# Patient Record
Sex: Female | Born: 1990 | Race: White | Hispanic: No | Smoking: Never smoker
Health system: Southern US, Community
[De-identification: ages and names within clinical notes are randomized; demographics above are authoritative.]

## PROBLEM LIST (undated history)

## (undated) DIAGNOSIS — F3281 Premenstrual dysphoric disorder: Secondary | ICD-10-CM

## (undated) DIAGNOSIS — I7300 Raynaud's syndrome without gangrene: Secondary | ICD-10-CM

## (undated) HISTORY — DX: Raynaud's syndrome without gangrene: I73.00

## (undated) HISTORY — PX: WISDOM TOOTH EXTRACTION: SHX21

## (undated) HISTORY — DX: Premenstrual dysphoric disorder: F32.81

---
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
## 2012-04-03 ENCOUNTER — Ambulatory Visit: Payer: Self-pay | Admitting: Family Medicine

## 2012-08-11 IMAGING — CT CT STONE STUDY
1 of 2 series · 15 of 32 positions shown, 19 images · non-contrast
Comparison: none

REASON FOR EXAM: [id number] fever   abd pain  kidney stones  hematuria
COMMENTS:

PROCEDURE:     CT  - CT ABDOMEN /PELVIS WO (STONE)  - April 03, 2012  [DATE]
RESULT:     Comparison: 06/14/2009
TECHNIQUE: Multiple axial images from the lung bases to the symphysis pubis
were obtained without oral and without intravenous contrast.

[Series 2: 3mm soft tissue · axial · 0.68mm/px · z∈[-718,-334]mm · 15 of 140 slices shown, 19 images]
[im 6/140  soft-tissue]
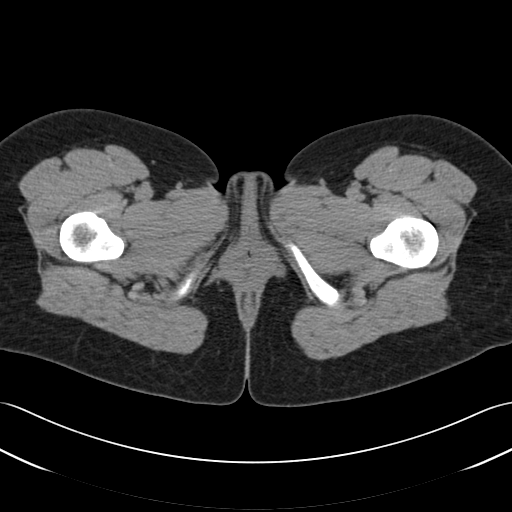
[im 6/140  bone]
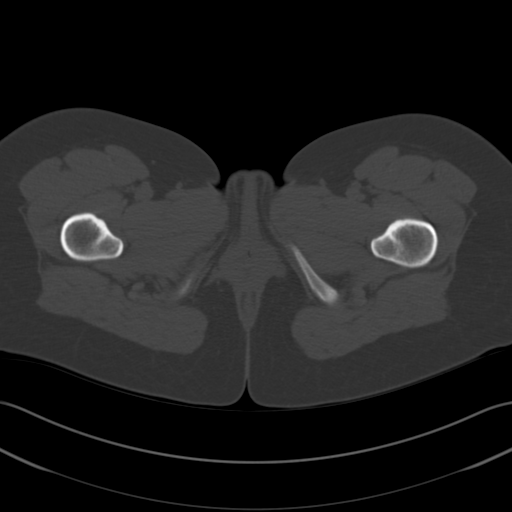
[im 18/140  soft-tissue]
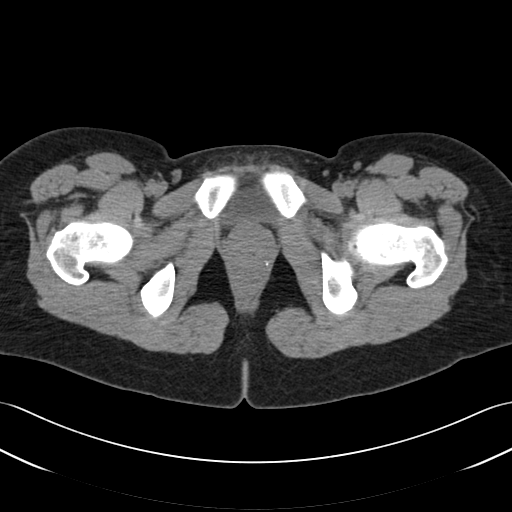
[im 29/140  soft-tissue]
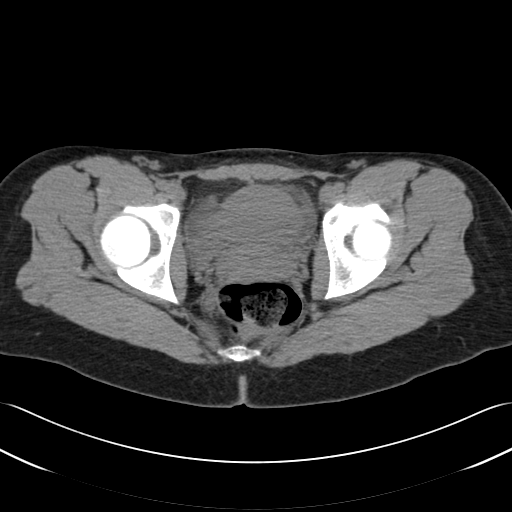
[im 41/140  soft-tissue]
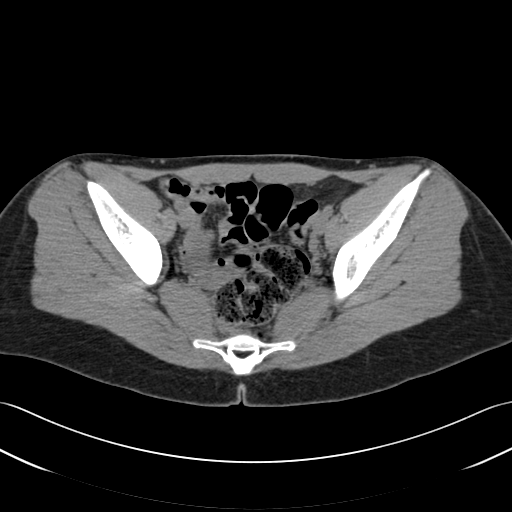
[im 47/140  soft-tissue]
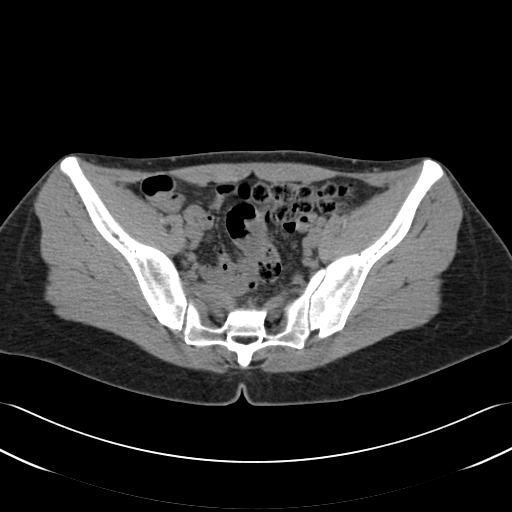
[im 58/140  soft-tissue]
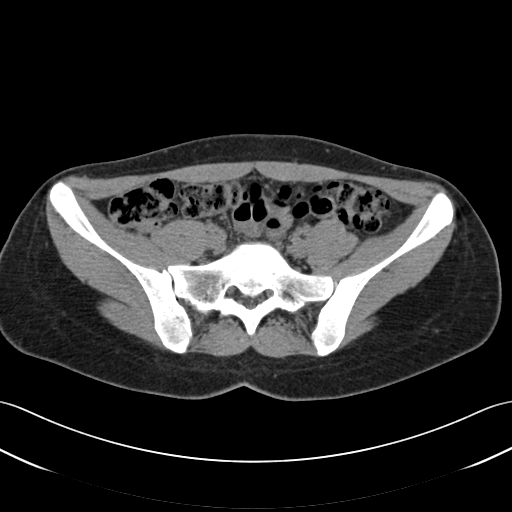
[im 70/140  soft-tissue]
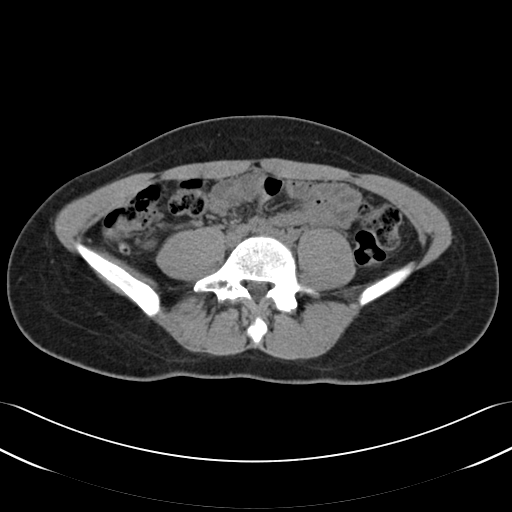
[im 82/140  soft-tissue]
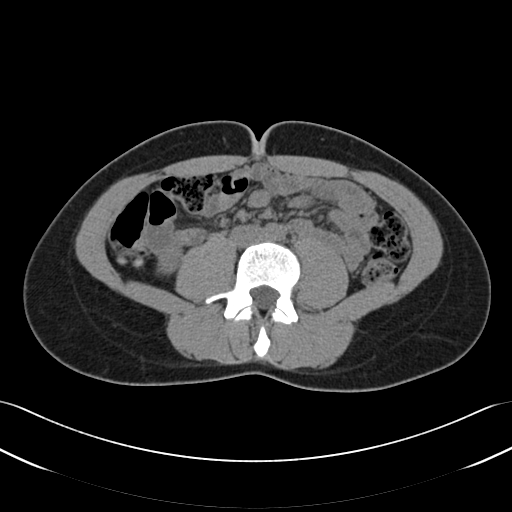
[im 93/140  soft-tissue]
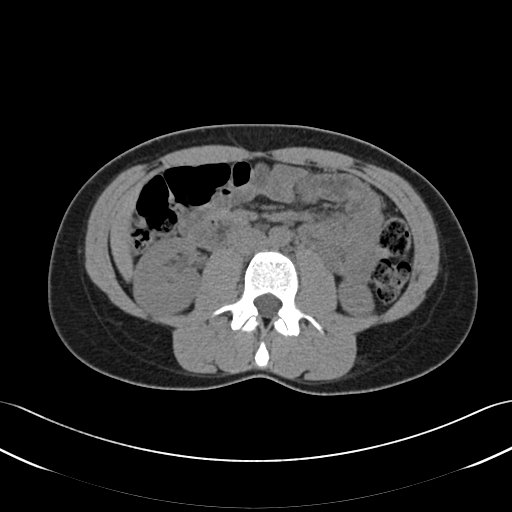
[im 93/140  bone]
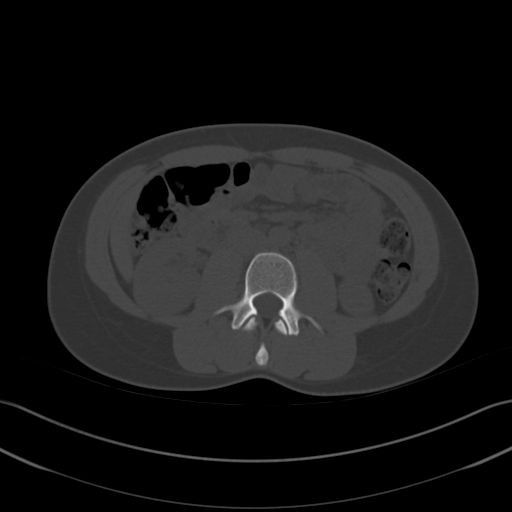
[im 99/140  soft-tissue]
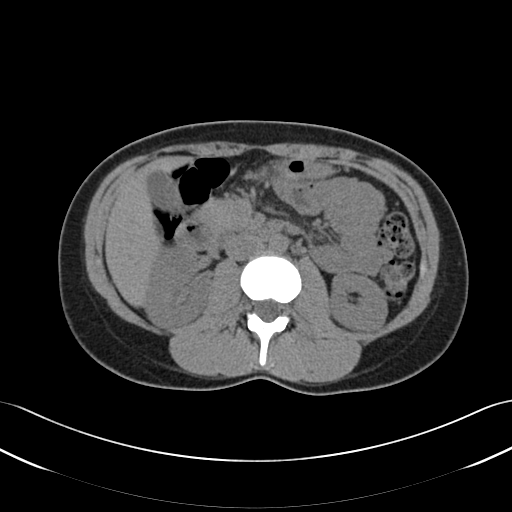
[im 111/140  soft-tissue]
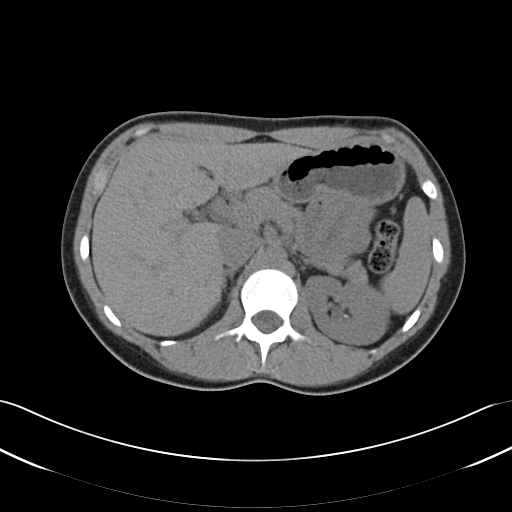
[im 116/140  lung]
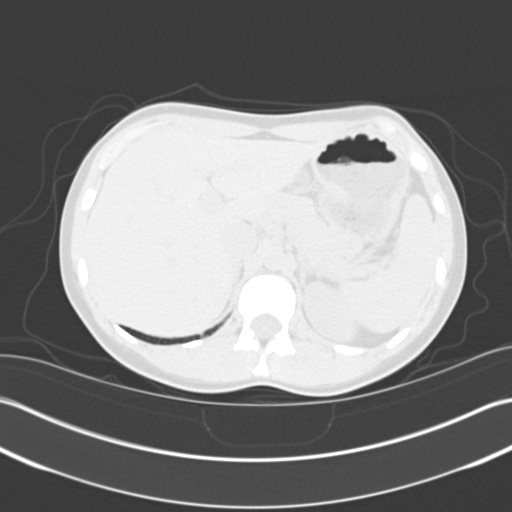
[im 122/140  soft-tissue]
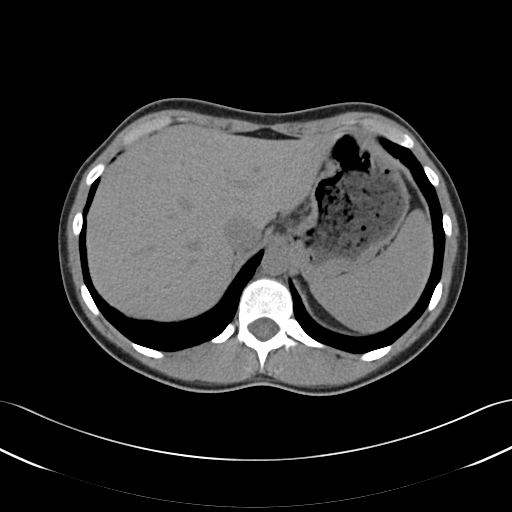
[im 122/140  lung]
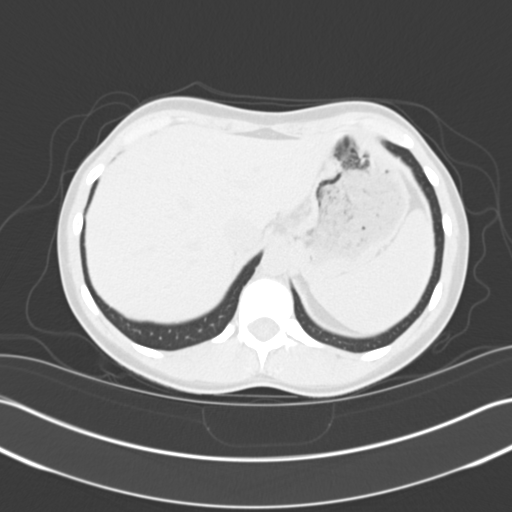
[im 128/140  lung]
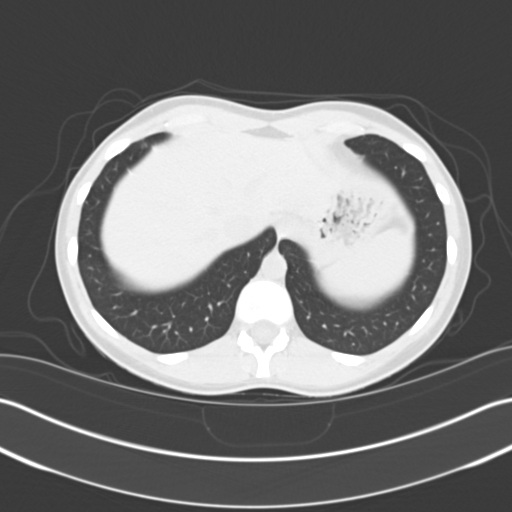
[im 134/140  soft-tissue]
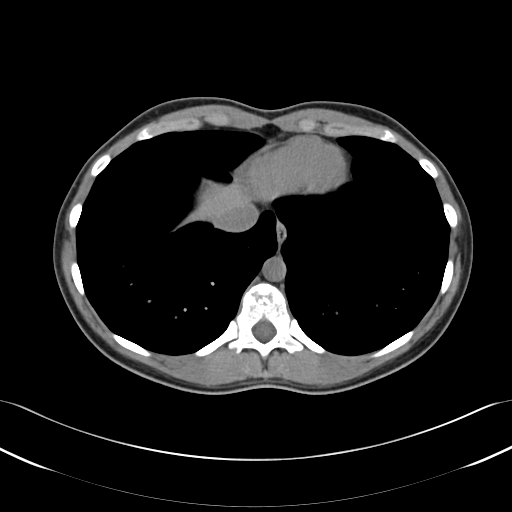
[im 134/140  lung]
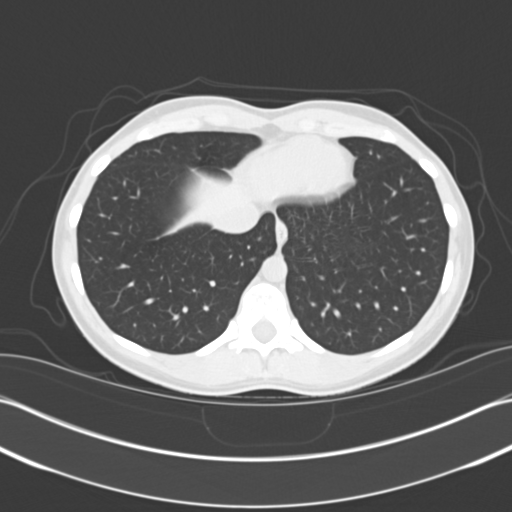

[15 of 32 positions shown; findings below may reference images not displayed]

FINDINGS: Lack of intravenous contrast limits evaluation of the solid abdominal
organs.  Grossly, the liver, spleen, adrenals, gallbladder, and pancreas are
unremarkable. There are several 2-3 mm calculi in the bilateral kidneys. No
hydronephrosis. Evaluation of the ureters is difficult secondary to the lack
of intra-abdominal fat and decompressed small bowel. However, no evidence of
ureterectasis.

The small and large bowel are normal in caliber. The appendix is normal in
caliber. There is some high density within the appendix which may represent
inspissated material.

No aggressive lytic or sclerotic osseous lesions are identified.
IMPRESSION: Bilateral nephrolithiasis, without evidence of obstruction.

## 2017-03-11 DIAGNOSIS — T63004A Toxic effect of unspecified snake venom, undetermined, initial encounter: Secondary | ICD-10-CM | POA: Insufficient documentation

## 2017-03-11 HISTORY — DX: Toxic effect of unspecified snake venom, undetermined, initial encounter: T63.004A

## 2019-09-18 NOTE — L&D Delivery Note (Signed)
Delivery Note At 12:46 PM a viable female was delivered via Vaginal, Spontaneous (Presentation:   Occiput Anterior).  APGAR: 9, 9; weight pending.   Placenta status: Spontaneous, Intact.  Cord: 3 vessels with the following complications: None.  Cord pH: n/a  Anesthesia: Epidural Episiotomy: None Lacerations: 2nd degree;Perineal Suture Repair: 3.0 vicryl Est. Blood Loss (mL): 320  Mom to postpartum.  Baby to Couplet care / Skin to Skin.  Called to see patient.  Mom pushed to deliver a viable female infant.  The head followed by shoulders, which delivered without difficulty, and the rest of the body.  No nuchal cord noted.  Baby to mom's chest.  Cord clamped and cut after > 1 min delay.  Cord blood obtained.  Placenta delivered spontaneously, intact, with a 3-vessel cord.  Second degree perineal laceration repaired with 3-0 Vicryl in standard fashion.  All counts correct.  Hemostasis obtained with IV pitocin and fundal massage. EBL 320 mL.    Thomasene Mohair, MD 08/30/2020, 4:23 PM

## 2020-01-18 ENCOUNTER — Emergency Department
Admission: EM | Admit: 2020-01-18 | Discharge: 2020-01-19 | Disposition: A | Discharge status: Home / Self Care | Payer: 59 | Attending: Emergency Medicine | Admitting: Emergency Medicine

## 2020-01-18 ENCOUNTER — Encounter: Payer: Self-pay | Admitting: Emergency Medicine

## 2020-01-18 DIAGNOSIS — Z3A01 Less than 8 weeks gestation of pregnancy: Secondary | ICD-10-CM | POA: Insufficient documentation

## 2020-01-18 DIAGNOSIS — O26891 Other specified pregnancy related conditions, first trimester: Secondary | ICD-10-CM | POA: Insufficient documentation

## 2020-01-18 DIAGNOSIS — R112 Nausea with vomiting, unspecified: Secondary | ICD-10-CM | POA: Insufficient documentation

## 2020-01-18 LAB — CBC
HCT: 34.8 % — ABNORMAL LOW (ref 36.0–46.0)
Hemoglobin: 13 g/dL (ref 12.0–15.0)
MCH: 32.7 pg (ref 26.0–34.0)
MCHC: 37.4 g/dL — ABNORMAL HIGH (ref 30.0–36.0)
MCV: 87.7 fL (ref 80.0–100.0)
Platelets: 224 10*3/uL (ref 150–400)
RBC: 3.97 MIL/uL (ref 3.87–5.11)
RDW: 11.7 % (ref 11.5–15.5)
WBC: 6.9 10*3/uL (ref 4.0–10.5)
nRBC: 0 % (ref 0.0–0.2)

## 2020-01-18 LAB — COMPREHENSIVE METABOLIC PANEL
ALT: 32 U/L (ref 0–44)
AST: 25 U/L (ref 15–41)
Albumin: 4.7 g/dL (ref 3.5–5.0)
Alkaline Phosphatase: 40 U/L (ref 38–126)
Anion gap: 9 (ref 5–15)
BUN: 11 mg/dL (ref 6–20)
CO2: 23 mmol/L (ref 22–32)
Calcium: 9.2 mg/dL (ref 8.9–10.3)
Chloride: 103 mmol/L (ref 98–111)
Creatinine, Ser: 0.67 mg/dL (ref 0.44–1.00)
GFR calc Af Amer: >60 mL/min (ref >60)
GFR calc non Af Amer: >60 mL/min (ref >60)
Glucose, Bld: 87 mg/dL (ref 70–99)
Potassium: 3.8 mmol/L (ref 3.5–5.1)
Sodium: 135 mmol/L (ref 135–145)
Total Bilirubin: 0.8 mg/dL (ref 0.3–1.2)
Total Protein: 7.9 g/dL (ref 6.5–8.1)

## 2020-01-18 LAB — URINALYSIS, COMPLETE (UACMP) WITH MICROSCOPIC
Bilirubin Urine: NEGATIVE
Glucose, UA: NEGATIVE mg/dL
Hgb urine dipstick: NEGATIVE
Ketones, ur: 20 mg/dL — AB
Leukocytes,Ua: NEGATIVE
Nitrite: NEGATIVE
Protein, ur: NEGATIVE mg/dL
Specific Gravity, Urine: 1.01 (ref 1.005–1.030)
pH: 6 (ref 5.0–8.0)

## 2020-01-18 LAB — HCG, QUANTITATIVE, PREGNANCY: hCG, Beta Chain, Quant, S: 165126 m[IU]/mL — ABNORMAL HIGH (ref <5)

## 2020-01-18 LAB — LIPASE, BLOOD: Lipase: 27 U/L (ref 11–51)

## 2020-01-18 LAB — ABO/RH: ABO/RH(D): O POS

## 2020-01-18 MED ORDER — ONDANSETRON HCL 4 MG PO TABS: 4.0000 mg | ORAL_TABLET | Freq: Three times a day (TID) | ORAL | 1 refills | Status: AC | PRN

## 2020-01-18 MED ORDER — CEPHALEXIN 500 MG PO CAPS: 500.0000 mg | ORAL_CAPSULE | Freq: Three times a day (TID) | ORAL | 0 refills | Status: AC

## 2020-01-18 MED ORDER — ONDANSETRON HCL 4 MG/2ML IJ SOLN
4.0000 mg | Freq: Once | INTRAMUSCULAR | Status: AC
  Administered 2020-01-18: 4 mg via INTRAVENOUS
  Filled 2020-01-18: qty 2

## 2020-01-18 MED ORDER — ONDANSETRON 4 MG PO TBDP
4.0000 mg | ORAL_TABLET | Freq: Once | ORAL | Status: AC
  Administered 2020-01-19: 4 mg via ORAL
  Filled 2020-01-18: qty 1

## 2020-01-18 MED ORDER — SODIUM CHLORIDE 0.9 % IV BOLUS
1000.0000 mL | Freq: Once | INTRAVENOUS | Status: AC
  Administered 2020-01-18: 22:00:00 1000 mL via INTRAVENOUS

## 2020-01-18 NOTE — ED Provider Notes (Signed)
Emergency Department Provider Note  ____________________________________________  Time seen: Approximately 10:05 PM  I have reviewed the triage vital signs and the nursing notes.   HISTORY  Chief Complaint Emesis and Nausea   Historian Patient     HPI Jean Gay is a 29 y.o. female presents to the emergency department with nausea and vomiting.  Patient is a G1, P0 at approximately 7 weeks.  She has had daily nausea since finding out she was pregnant and has been unable to tolerate fluids at home.   History reviewed. No pertinent past medical history.   Immunizations up to date:  Yes.     History reviewed. No pertinent past medical history.  There are no problems to display for this patient.   History reviewed. No pertinent surgical history.  Prior to Admission medications   Medication Sig Start Date End Date Taking? Authorizing Provider  cephALEXin (KEFLEX) 500 MG capsule Take 1 capsule (500 mg total) by mouth 3 (three) times daily for 7 days. 01/18/20 01/25/20  Lannie Fields, PA-C  ondansetron (ZOFRAN) 4 MG tablet Take 1 tablet (4 mg total) by mouth every 8 (eight) hours as needed for up to 7 days for nausea or vomiting. 01/18/20 01/25/20  Lannie Fields, PA-C    Allergies Patient has no known allergies.  History reviewed. No pertinent family history.  Social History Social History   Tobacco Use  . Smoking status: Never Smoker  . Smokeless tobacco: Never Used  Substance Use Topics  . Alcohol use: Not on file  . Drug use: Not on file     Review of Systems  Constitutional: No fever/chills Eyes:  No discharge ENT: No upper respiratory complaints. Respiratory: no cough. No SOB/ use of accessory muscles to breath Gastrointestinal:  Patient has nausea and emesis.  No diarrhea.  No constipation. Musculoskeletal: Negative for musculoskeletal pain.     ____________________________________________   PHYSICAL EXAM:  VITAL SIGNS: ED Triage Vitals  [01/18/20 1908]  Enc Vitals Group     BP 134/84     Pulse Rate 85     Resp 18     Temp 98 F (36.7 C)     Temp Source Oral     SpO2 100 %     Weight      Height      Head Circumference      Peak Flow      Pain Score      Pain Loc      Pain Edu?      Excl. in [place]?      Constitutional: Alert and oriented. Well appearing and in no acute distress. Eyes: Conjunctivae are normal. PERRL. EOMI. Head: Atraumatic. Cardiovascular: Normal rate, regular rhythm. Normal S1 and S2.  Good peripheral circulation. Respiratory: Normal respiratory effort without tachypnea or retractions. Lungs CTAB. Good air entry to the bases with no decreased or absent breath sounds Gastrointestinal: Bowel sounds x 4 quadrants. Soft and nontender to palpation. No guarding or rigidity. No distention. Musculoskeletal: Full range of motion to all extremities. No obvious deformities noted Neurologic:  Normal for age. No gross focal neurologic deficits are appreciated.  Skin:  Skin is warm, dry and intact. No rash noted. Psychiatric: Mood and affect are normal for age. Speech and behavior are normal.   ____________________________________________   LABS (all labs ordered are listed, but only abnormal results are displayed)  Labs Reviewed  CBC - Abnormal; Notable for the following components:      Result Value  HCT 34.8 (*)    MCHC 37.4 (*)    All other components within normal limits  URINALYSIS, COMPLETE (UACMP) WITH MICROSCOPIC - Abnormal; Notable for the following components:   Color, Urine YELLOW (*)    APPearance CLEAR (*)    Ketones, ur 20 (*)    Bacteria, UA RARE (*)    All other components within normal limits  HCG, QUANTITATIVE, PREGNANCY - Abnormal; Notable for the following components:   hCG, Beta Chain, Quant, S 165,126 (*)    All other components within normal limits  LIPASE, BLOOD  COMPREHENSIVE METABOLIC PANEL  ABO/RH    ____________________________________________  EKG   ____________________________________________  RADIOLOGY   No results found.  ____________________________________________    PROCEDURES  Procedure(s) performed:     Procedures     Medications  ondansetron (ZOFRAN-ODT) disintegrating tablet 4 mg (has no administration in time range)  sodium chloride 0.9 % bolus 1,000 mL (0 mLs Intravenous Stopped 01/18/20 2308)  ondansetron (ZOFRAN) injection 4 mg (4 mg Intravenous Given 01/18/20 2133)     ____________________________________________   INITIAL IMPRESSION / ASSESSMENT AND PLAN / ED COURSE  Pertinent labs & imaging results that were available during my care of the patient were reviewed by me and considered in my medical decision making (see chart for details).     Assessment and Plan:  Hyperemesis of pregnancy 29 year old female presents to the emergency department with hyperemesis of the first trimester.  Vital signs were reassuring at triage.  On physical exam, abdomen was soft and nontender without guarding.  Beta-hCG was elevated as expected.  CMP was within reference range.  Lipase  was within reference range.  Patient was given supplemental fluids and Zofran in the emergency department and passed a p.o. challenge.  She was discharged with Zofran and advised to consume frequent small amounts of fluid over the next several days.  Patient denied abdominal pain, pelvic pain or vaginal bleeding during this emergency department encounter.  Return precautions were given to return with new or worsening symptoms.  Patient was discharged with Keflex given bacteriuria identified on UA.   ____________________________________________  FINAL CLINICAL IMPRESSION(S) / ED DIAGNOSES  Final diagnoses:  Non-intractable vomiting with nausea, unspecified vomiting type      NEW MEDICATIONS STARTED DURING THIS VISIT:  ED Discharge Orders         Ordered    ondansetron  (ZOFRAN) 4 MG tablet  Every 8 hours PRN     01/18/20 2301    cephALEXin (KEFLEX) 500 MG capsule  3 times daily     01/18/20 2309              This chart was dictated using voice recognition software/Dragon. Despite best efforts to proofread, errors can occur which can change the meaning. Any change was purely unintentional.     Orvil Feil, PA-C 01/18/20 2312    Dionne Bucy, MD 01/18/20 [phone]

## 2020-01-18 NOTE — ED Triage Notes (Signed)
Pt reports she is approx. [redacted] weeks pregnant and has been experiencing increased N/V with weight loss over the last 2 weeks. Pt unable to get into OB until 5/12. Pt concerned for dehydration.

## 2020-01-18 NOTE — Discharge Instructions (Addendum)
Take Zofran up to three times daily.  Drink small amounts of liquids frequently throughout the day. Return to the emergency department with new or worsening symptoms.

## 2020-01-27 ENCOUNTER — Ambulatory Visit (INDEPENDENT_AMBULATORY_CARE_PROVIDER_SITE_OTHER): Payer: 59 | Admitting: Obstetrics and Gynecology

## 2020-01-27 ENCOUNTER — Encounter: Payer: Self-pay | Admitting: Obstetrics and Gynecology

## 2020-01-27 ENCOUNTER — Other Ambulatory Visit: Payer: Self-pay

## 2020-01-27 VITALS — BP 110/64 | HR 84 | Ht 65.75 in | Wt 150.0 lb

## 2020-01-27 DIAGNOSIS — Z3401 Encounter for supervision of normal first pregnancy, first trimester: Secondary | ICD-10-CM

## 2020-01-27 DIAGNOSIS — Z3480 Encounter for supervision of other normal pregnancy, unspecified trimester: Secondary | ICD-10-CM | POA: Insufficient documentation

## 2020-01-27 DIAGNOSIS — O210 Mild hyperemesis gravidarum: Secondary | ICD-10-CM

## 2020-01-27 DIAGNOSIS — K5900 Constipation, unspecified: Secondary | ICD-10-CM

## 2020-01-27 DIAGNOSIS — Z3A01 Less than 8 weeks gestation of pregnancy: Secondary | ICD-10-CM

## 2020-01-27 DIAGNOSIS — O99611 Diseases of the digestive system complicating pregnancy, first trimester: Secondary | ICD-10-CM

## 2020-01-27 LAB — OB RESULTS CONSOLE VARICELLA ZOSTER ANTIBODY, IGG: Varicella: NON-IMMUNE/NOT IMMUNE

## 2020-01-27 MED ORDER — POLYETHYLENE GLYCOL 3350 17 G PO PACK: 17.0000 g | PACK | Freq: Every day | ORAL | 11 refills | Status: DC

## 2020-01-27 MED ORDER — DOCUSATE SODIUM 100 MG PO CAPS: 100.0000 mg | ORAL_CAPSULE | Freq: Two times a day (BID) | ORAL | 3 refills | Status: DC | PRN

## 2020-01-27 MED ORDER — PROMETHAZINE HCL 25 MG PO TABS: 25.0000 mg | ORAL_TABLET | Freq: Four times a day (QID) | ORAL | 3 refills | Status: DC | PRN

## 2020-01-27 MED ORDER — ONDANSETRON 4 MG PO TBDP: 4.0000 mg | ORAL_TABLET | Freq: Four times a day (QID) | ORAL | 3 refills | Status: DC | PRN

## 2020-01-27 NOTE — Patient Instructions (Addendum)
Initial steps to help :   B6 (pyridoxine) 25 mg,  3-4 times a day Unisom (doxylamine) 25 mg at bedtime **B6 and Unisom are available as a combination prescription medications called diclegis and bonjesta  B1 (thiamin)  50-100 mg 1-4 a day  Continue prenatal vitamin with iron and thiamin. If it is not tolerated switch to 1 mg of folic acid.  Can add medication for gastric reflux if needed.  Subsequent steps to be added to B1, B6, and Unisom:  1. Antihistamine (one of the following medications) Dramamine      25-50 mg every 4-6 hours Benadryl      25-50 mg every 4-6 hours Meclizine      25 mg every 6 hours  2. Dopamine Antagonist (one of the following medications) Metoclopramide  (Reglan)  5-10 mg every 6-8 hours         PO Promethazine   (Phenergan)   12.5-25 mg every 4-6 hours      PO or rectal Prochlorperazine  (Compazine)  5-10 mg every 6-8 hours     25mg  BID rectally    Subsequent steps if there has still not been improvement in symptoms:  3. Daily stool softner  4. Ondansetron  (Zofran)   4-8 mg every 6-8 hours      First Trimester of Pregnancy The first trimester of pregnancy is from week 1 until the end of week 13 (months 1 through 3). A week after a sperm fertilizes an egg, the egg will implant on the wall of the uterus. This embryo will begin to develop into a baby. Genes from you and your partner will form the baby. The female genes will determine whether the baby will be a boy or a girl. At 6-8 weeks, the eyes and face will be formed, and the heartbeat can be seen on ultrasound. At the end of 12 weeks, all the baby's organs will be formed. Now that you are pregnant, you will want to do everything you can to have a healthy baby. Two of the most important things are to get good prenatal care and to follow your health care provider's instructions. Prenatal care is all the medical care you receive before the baby's birth. This care will help prevent, find, and treat any problems  during the pregnancy and childbirth. Body changes during your first trimester Your body goes through many changes during pregnancy. The changes vary from woman to woman.  You may gain or lose a couple of pounds at first.  You may feel sick to your stomach (nauseous) and you may throw up (vomit). If the vomiting is uncontrollable, call your health care provider.  You may tire easily.  You may develop headaches that can be relieved by medicines. All medicines should be approved by your health care provider.  You may urinate more often. Painful urination may mean you have a bladder infection.  You may develop heartburn as a result of your pregnancy.  You may develop constipation because certain hormones are causing the muscles that push stool through your intestines to slow down.  You may develop hemorrhoids or swollen veins (varicose veins).  Your breasts may begin to grow larger and become tender. Your nipples may stick out more, and the tissue that surrounds them (areola) may become darker.  Your gums may bleed and may be sensitive to brushing and flossing.  Dark spots or blotches (chloasma, mask of pregnancy) may develop on your face. This will likely fade after the baby is  born.  Your menstrual periods will stop.  You may have a loss of appetite.  You may develop cravings for certain kinds of food.  You may have changes in your emotions from day to day, such as being excited to be pregnant or being concerned that something may go wrong with the pregnancy and baby.  You may have more vivid and strange dreams.  You may have changes in your hair. These can include thickening of your hair, rapid growth, and changes in texture. Some women also have hair loss during or after pregnancy, or hair that feels dry or thin. Your hair will most likely return to normal after your baby is born. What to expect at prenatal visits During a routine prenatal visit:  You will be weighed to make  sure you and the baby are growing normally.  Your blood pressure will be taken.  Your abdomen will be measured to track your baby's growth.  The fetal heartbeat will be listened to between weeks 10 and 14 of your pregnancy.  Test results from any previous visits will be discussed. Your health care provider may ask you:  How you are feeling.  If you are feeling the baby move.  If you have had any abnormal symptoms, such as leaking fluid, bleeding, severe headaches, or abdominal cramping.  If you are using any tobacco products, including cigarettes, chewing tobacco, and electronic cigarettes.  If you have any questions. Other tests that may be performed during your first trimester include:  Blood tests to find your blood type and to check for the presence of any previous infections. The tests will also be used to check for low iron levels (anemia) and protein on red blood cells (Rh antibodies). Depending on your risk factors, or if you previously had diabetes during pregnancy, you may have tests to check for high blood sugar that affects pregnant women (gestational diabetes).  Urine tests to check for infections, diabetes, or protein in the urine.  An ultrasound to confirm the proper growth and development of the baby.  Fetal screens for spinal cord problems (spina bifida) and Down syndrome.  HIV (human immunodeficiency virus) testing. Routine prenatal testing includes screening for HIV, unless you choose not to have this test.  You may need other tests to make sure you and the baby are doing well. Follow these instructions at home: Medicines  Follow your health care provider's instructions regarding medicine use. Specific medicines may be either safe or unsafe to take during pregnancy.  Take a prenatal vitamin that contains at least 600 micrograms (mcg) of folic acid.  If you develop constipation, try taking a stool softener if your health care provider approves. Eating and  drinking   Eat a balanced diet that includes fresh fruits and vegetables, whole grains, good sources of protein such as meat, eggs, or tofu, and low-fat dairy. Your health care provider will help you determine the amount of weight gain that is right for you.  Avoid raw meat and uncooked cheese. These carry germs that can cause birth defects in the baby.  Eating four or five small meals rather than three large meals a day may help relieve nausea and vomiting. If you start to feel nauseous, eating a few soda crackers can be helpful. Drinking liquids between meals, instead of during meals, also seems to help ease nausea and vomiting.  Limit foods that are high in fat and processed sugars, such as fried and sweet foods.  To prevent constipation: ? Eat  foods that are high in fiber, such as fresh fruits and vegetables, whole grains, and beans. ? Drink enough fluid to keep your urine clear or pale yellow. Activity  Exercise only as directed by your health care provider. Most women can continue their usual exercise routine during pregnancy. Try to exercise for 30 minutes at least 5 days a week. Exercising will help you: ? Control your weight. ? Stay in shape. ? Be prepared for labor and delivery.  Experiencing pain or cramping in the lower abdomen or lower back is a good sign that you should stop exercising. Check with your health care provider before continuing with normal exercises.  Try to avoid standing for long periods of time. Move your legs often if you must stand in one place for a long time.  Avoid heavy lifting.  Wear low-heeled shoes and practice good posture.  You may continue to have sex unless your health care provider tells you not to. Relieving pain and discomfort  Wear a good support bra to relieve breast tenderness.  Take warm sitz baths to soothe any pain or discomfort caused by hemorrhoids. Use hemorrhoid cream if your health care provider approves.  Rest with your  legs elevated if you have leg cramps or low back pain.  If you develop varicose veins in your legs, wear support hose. Elevate your feet for 15 minutes, 3-4 times a day. Limit salt in your diet. Prenatal care  Schedule your prenatal visits by the twelfth week of pregnancy. They are usually scheduled monthly at first, then more often in the last 2 months before delivery.  Write down your questions. Take them to your prenatal visits.  Keep all your prenatal visits as told by your health care provider. This is important. Safety  Wear your seat belt at all times when driving.  Make a list of emergency phone numbers, including numbers for family, friends, the hospital, and police and fire departments. General instructions  Ask your health care provider for a referral to a local prenatal education class. Begin classes no later than the beginning of month 6 of your pregnancy.  Ask for help if you have counseling or nutritional needs during pregnancy. Your health care provider can offer advice or refer you to specialists for help with various needs.  Do not use hot tubs, steam rooms, or saunas.  Do not douche or use tampons or scented sanitary pads.  Do not cross your legs for long periods of time.  Avoid cat litter boxes and soil used by cats. These carry germs that can cause birth defects in the baby and possibly loss of the fetus by miscarriage or stillbirth.  Avoid all smoking, herbs, alcohol, and medicines not prescribed by your health care provider. Chemicals in these products affect the formation and growth of the baby.  Do not use any products that contain nicotine or tobacco, such as cigarettes and e-cigarettes. If you need help quitting, ask your health care provider. You may receive counseling support and other resources to help you quit.  Schedule a dentist appointment. At home, brush your teeth with a soft toothbrush and be gentle when you floss. Contact a health care provider  if:  You have dizziness.  You have mild pelvic cramps, pelvic pressure, or nagging pain in the abdominal area.  You have persistent nausea, vomiting, or diarrhea.  You have a bad smelling vaginal discharge.  You have pain when you urinate.  You notice increased swelling in your face, hands, legs,  or ankles.  You are exposed to fifth disease or chickenpox.  You are exposed to [place] measles (rubella) and have never had it. Get help right away if:  You have a fever.  You are leaking fluid from your vagina.  You have spotting or bleeding from your vagina.  You have severe abdominal cramping or pain.  You have rapid weight gain or loss.  You vomit blood or material that looks like coffee grounds.  You develop a severe headache.  You have shortness of breath.  You have any kind of trauma, such as from a fall or a car accident. Summary  The first trimester of pregnancy is from week 1 until the end of week 13 (months 1 through 3).  Your body goes through many changes during pregnancy. The changes vary from woman to woman.  You will have routine prenatal visits. During those visits, your health care provider will examine you, discuss any test results you may have, and talk with you about how you are feeling. This information is not intended to replace advice given to you by your health care provider. Make sure you discuss any questions you have with your health care provider. Document Revised: 08/16/2017 Document Reviewed: 08/15/2016 Elsevier Patient Education  2020 [employer]   Hyperemesis Gravidarum Hyperemesis gravidarum is a severe form of nausea and vomiting that happens during pregnancy. Hyperemesis is worse than morning sickness. It may cause you to have nausea or vomiting all day for many days. It may keep you from eating and drinking enough food and liquids, which can lead to dehydration, malnutrition, and weight loss. Hyperemesis usually occurs during the first  half (the first 20 weeks) of pregnancy. It often goes away once a woman is in her second half of pregnancy. However, sometimes hyperemesis continues through an entire pregnancy. What are the causes? The cause of this condition is not known. It may be related to changes in chemicals (hormones) in the body during pregnancy, such as the high level of pregnancy hormone (human chorionic gonadotropin) or the increase in the female sex hormone (estrogen). What are the signs or symptoms? Symptoms of this condition include:  Nausea that does not go away.  Vomiting that does not allow you to keep any food down.  Weight loss.  Body fluid loss (dehydration).  Having no desire to eat, or not liking food that you have previously enjoyed. How is this diagnosed? This condition may be diagnosed based on:  A physical exam.  Your medical history.  Your symptoms.  Blood tests.  Urine tests. How is this treated? This condition is managed by controlling symptoms. This may include:  Following an eating plan. This can help lessen nausea and vomiting.  Taking prescription medicines. An eating plan and medicines are often used together to help control symptoms. If medicines do not help relieve nausea and vomiting, you may need to receive fluids through an IV at the hospital. Follow these instructions at home: Eating and drinking   Avoid the following: ? Drinking fluids with meals. Try not to drink anything during the 30 minutes before and after your meals. ? Drinking more than 1 cup of fluid at a time. ? Eating foods that trigger your symptoms. These may include spicy foods, coffee, high-fat foods, very sweet foods, and acidic foods. ? Skipping meals. Nausea can be more intense on an empty stomach. If you cannot tolerate food, do not force it. Try sucking on ice chips or other frozen items and make  up for missed calories later. ? Lying down within 2 hours after eating. ? Being exposed to  environmental triggers. These may include food smells, smoky rooms, closed spaces, rooms with strong smells, warm or humid places, overly loud and noisy rooms, and rooms with motion or flickering lights. Try eating meals in a well-ventilated area that is free of strong smells. ? Quick and sudden changes in your movement. ? Taking iron pills and multivitamins that contain iron. If you take prescription iron pills, do not stop taking them unless your health care provider approves. ? Preparing food. The smell of food can spoil your appetite or trigger nausea.  To help relieve your symptoms: ? Listen to your body. Everyone is different and has different preferences. Find what works best for you. ? Eat and drink slowly. ? Eat 5-6 small meals daily instead of 3 large meals. Eating small meals and snacks can help you avoid an empty stomach. ? In the morning, before getting out of bed, eat a couple of crackers to avoid moving around on an empty stomach. ? Try eating starchy foods as these are usually tolerated well. Examples include cereal, toast, bread, potatoes, pasta, rice, and pretzels. ? Include at least 1 serving of protein with your meals and snacks. Protein options include lean meats, poultry, seafood, beans, nuts, nut butters, eggs, cheese, and yogurt. ? Try eating a protein-rich snack before bed. Examples of a protein-rick snack include cheese and crackers or a peanut butter sandwich made with 1 slice of whole-wheat bread and 1 tsp (5 g) of peanut butter. ? Eat or suck on things that have ginger in them. It may help relieve nausea. Add  tsp ground ginger to hot tea or choose ginger tea. ? Try drinking 100% fruit juice or an electrolyte drink. An electrolyte drink contains sodium, potassium, and chloride. ? Drink fluids that are cold, clear, and carbonated or sour. Examples include lemonade, ginger ale, lemon-lime soda, ice water, and sparkling water. ? Brush your teeth or use a mouth rinse after  meals. ? Talk with your health care provider about starting a supplement of vitamin B6. General instructions  Take over-the-counter and prescription medicines only as told by your health care provider.  Follow instructions from your health care provider about eating or drinking restrictions.  Continue to take your prenatal vitamins as told by your health care provider. If you are having trouble taking your prenatal vitamins, talk with your health care provider about different options.  Keep all follow-up and pre-birth (prenatal) visits as told by your health care provider. This is important. Contact a health care provider if:  You have pain in your abdomen.  You have a severe headache.  You have vision problems.  You are losing weight.  You feel weak or dizzy. Get help right away if:  You cannot drink fluids without vomiting.  You vomit blood.  You have constant nausea and vomiting.  You are very weak.  You faint.  You have a fever and your symptoms suddenly get worse. Summary  Hyperemesis gravidarum is a severe form of nausea and vomiting that happens during pregnancy.  Making some changes to your eating habits may help relieve nausea and vomiting.  This condition may be managed with medicine.  If medicines do not help relieve nausea and vomiting, you may need to receive fluids through an IV at the hospital. This information is not intended to replace advice given to you by your health care provider. Make sure you  discuss any questions you have with your health care provider. Document Revised: 09/23/2017 Document Reviewed: 05/02/2016 Elsevier Patient Education  2020 [employer].

## 2020-01-27 NOTE — Progress Notes (Signed)
01/27/2020   Chief Complaint: Missed period  Transfer of Care Patient: no  History of Present Illness: Jean Gay is a 29 y.o. G1P0 [redacted]w[redacted]d based on Patient's last menstrual period was 12/04/2019 (exact date). with an Estimated Date of Delivery: 09/09/20, with the above CC.   Her periods were: regular periods every month She was using no method when she conceived.  She has Positive signs or symptoms of nausea/vomiting of pregnancy.  She reports severe nausea and vomiting this pregnancy.  She has been vomiting multiple times a day.  She reports that she has experienced dehydration from the nausea vomiting.  She was seen in the ED and given a prescription for Zofran.  She reports that with consistently taking the Zofran that helps reduce the nausea vomiting 2 or 3 times a week.  She only had a sufficient pills to get here today and request a refill.  She reports that she has had constipation secondary to taking Zofran.  She reports that she has not had a bowel movement in 1 week.  She did take MiraLAX this morning and is starting to feel the urge to have a bowel movement.  She has Negative signs or symptoms of miscarriage or preterm labor She was not taking different medications around the time she conceived/early pregnancy. Since her LMP, she has not used alcohol Since her LMP, she has not used tobacco products Since her LMP, she has not used illegal drugs.    Current or past history of domestic violence. no  Infection History:  1. Since her LMP, she has not had a viral illness.  2. She denies close contact with children on a regular basis     3. She denies a history of chicken pox. She denies vaccination for chicken pox in the past. 4. Patient or partner has history of genital herpes  no 5. History of STI (GC, CT, HPV, syphilis, HIV)  no   6.  She does not live with someone with TB or TB exposed. 7. History of recent travel :  no 8. She identifies Negative Zika risk factors for her and  her partner 25. There are not cats in the home in the home.  She understands that while pregnant she should not change cat litter.   Genetic Screening Questions: (Includes patient, baby's father, or anyone in either family)   1. Patient's age >/= 46 at [facility]  no 2. Thalassemia ([place], [place], Mediterranean, or Asian background): MCV<80  no 3. Neural tube defect (meningomyelocele, spina bifida, anencephaly)  no 4. Congenital heart defect  no  5. Down syndrome  no 6. Tay-Sachs (Jewish, [place]  no 7. Canavan's Disease  no 8. Sickle cell disease or trait (African)  no  9. Hemophilia or other blood disorders  no  10. Muscular dystrophy  no  11. Cystic fibrosis  no  12. Huntington's Chorea  no  13. Mental retardation/autism  no 14. Other inherited genetic or chromosomal disorder  no 15. Maternal metabolic disorder (DM, PKU, etc)  no 16. Patient or FOB with a child with a birth defect not listed above no  16a. Patient or FOB with a birth defect themselves no 17. Recurrent pregnancy loss, or stillbirth  no  18. Any medications since LMP other than prenatal vitamins (include vitamins, supplements, OTC meds, drugs, alcohol)  no 19. Any other genetic/environmental exposure to discuss  no  ROS:  Review of Systems  Constitutional: Positive for malaise/fatigue. Negative for chills, fever and weight loss.  HENT: Positive for congestion. Negative for hearing loss and sinus pain.   Eyes: Negative for blurred vision and double vision.  Respiratory: Negative for cough, sputum production, shortness of breath and wheezing.   Cardiovascular: Negative for chest pain, palpitations, orthopnea and leg swelling.  Gastrointestinal: Negative for abdominal pain, constipation, diarrhea, nausea and vomiting.  Genitourinary: Negative for dysuria, flank pain, frequency, hematuria and urgency.  Musculoskeletal: Negative for back pain, falls and joint pain.  Skin: Negative for itching and rash.    Neurological: Positive for dizziness and headaches.  Psychiatric/Behavioral: Negative for depression, substance abuse and suicidal ideas. The patient is not nervous/anxious.     OBGYN History: As per HPI. OB History  Gravida Para Term Preterm AB Living  1            SAB TAB Ectopic Multiple Live Births               # Outcome Date GA Lbr Len/2nd Weight Sex Delivery Anes PTL Lv  1 Current             Any issues with any prior pregnancies: no Any prior children are healthy, doing well, without any problems or issues: not applicable Last pap smear 2020- [facility] on colposcopy History of STIs: No   Past Medical History: No past medical history on file.  Past Surgical History: No past surgical history on file.  Family History:  Family History  Problem Relation Age of Onset  . Diabetes Maternal Grandfather   . Hypertension Maternal Grandfather   . Heart disease Maternal Grandfather    She denies any female cancers, bleeding or blood clotting disorders.   Social History:  Social History   Socioeconomic History  . Marital status: Legally Separated    Spouse name: Not on file  . Number of children: Not on file  . Years of education: Not on file  . Highest education level: Not on file  Occupational History  . Not on file  Tobacco Use  . Smoking status: Never Smoker  . Smokeless tobacco: Never Used  Substance and Sexual Activity  . Alcohol use: Not Currently  . Drug use: Never  . Sexual activity: Yes    Birth control/protection: None  Other Topics Concern  . Not on file  Social History Narrative  . Not on file   Social Determinants of Health   Financial Resource Strain:   . Difficulty of Paying Living Expenses:   Food Insecurity:   . Worried About Charity fundraiser in the Last Year:   . Arboriculturist in the Last Year:   Transportation Needs:   . Film/video editor (Medical):   Marland Kitchen Lack of Transportation (Non-Medical):   Physical Activity:   . Days of  Exercise per Week:   . Minutes of Exercise per Session:   Stress:   . Feeling of Stress :   Social Connections:   . Frequency of Communication with Friends and Family:   . Frequency of Social Gatherings with Friends and Family:   . Attends Religious Services:   . Active Member of Clubs or Organizations:   . Attends Archivist Meetings:   Marland Kitchen Marital Status:   Intimate Partner Violence:   . Fear of Current or Ex-Partner:   . Emotionally Abused:   Marland Kitchen Physically Abused:   . Sexually Abused:     Allergy: No Known Allergies  Current Outpatient Medications: No current outpatient medications on file.   Physical Exam: Physical Exam  Constitutional: She is oriented to person, place, and time and well-developed, well-nourished, and in no distress.  HENT:  Head: Normocephalic and atraumatic.  Eyes: Pupils are equal, round, and reactive to light.  Neck: No thyromegaly present.  Cardiovascular: Normal rate and regular rhythm.  Pulmonary/Chest: Effort normal.  Abdominal: Soft. Bowel sounds are normal. She exhibits no distension. There is no abdominal tenderness. There is no rebound and no guarding.  Musculoskeletal:        General: Normal range of motion.     Cervical back: Normal range of motion and neck supple.  Neurological: She is alert and oriented to person, place, and time.  Skin: Skin is warm and dry.  Psychiatric: Affect and judgment normal.  Nursing note and vitals reviewed.    Assessment: Jean Gay is a 29 y.o. G1P0 [redacted]w[redacted]d based on Patient's last menstrual period was 12/04/2019 (exact date). with an Estimated Date of Delivery: 09/09/20,  for prenatal care.  Plan:  1) Avoid alcoholic beverages. 2) Patient encouraged not to smoke.  3) Discontinue the use of all non-medicinal drugs and chemicals.  4) Take prenatal vitamins daily.  5) Seatbelt use advised 6) Nutrition, food safety (fish, cheese advisories, and high nitrite foods) and exercise discussed. 7)  Hospital and practice style delivering at [facility] discussed  8) Patient is asked about travel to areas at risk for the Zika virus, and counseled to avoid travel and exposure to mosquitoes or sexual partners who may have themselves been exposed to the virus. Testing is discussed, and will be ordered as appropriate.  9) Childbirth classes at [facility] advised 10) Genetic Screening, such as with 1st Trimester Screening, cell free fetal DNA, AFP testing, and Ultrasound, as well as with amniocentesis and CVS as appropriate, is discussed with patient. She is undecided about genetic testing this pregnancy.  Reviewed plan for management of hyperemesis. Discussed reason for hospitalization related to hyperemesis.  Provided patient with prescription for medications Patient uncomfortable today and reports she need to have a BM. She declines pelvic exam for that reason. Needs to update pap and STD testing at her next appointment Return in 1 week for dating [place].   Problem list reviewed and updated.  I discussed the assessment and treatment plan with the patient. The patient was provided an opportunity to ask questions and all were answered. The patient agreed with the plan and demonstrated an understanding of the instructions.  Adelene Idler MD [facility], [facility] 01/27/2020 1:48 PM

## 2020-01-27 NOTE — Progress Notes (Signed)
Requesting zofran rx for nausea. She received at ED, but has taken all she was rx'd (1 wk worth)

## 2020-01-28 LAB — RPR+RH+ABO+RUB AB+AB SCR+CB...
Antibody Screen: NEGATIVE
HIV Screen 4th Generation wRfx: NONREACTIVE
Hematocrit: 34.9 % (ref 34.0–46.6)
Hemoglobin: 12.3 g/dL (ref 11.1–15.9)
Hepatitis B Surface Ag: NEGATIVE
MCH: 32.3 pg (ref 26.6–33.0)
MCHC: 35.2 g/dL (ref 31.5–35.7)
MCV: 92 fL (ref 79–97)
Platelets: 232 10*3/uL (ref 150–450)
RBC: 3.81 x10E6/uL (ref 3.77–5.28)
RDW: 11.9 % (ref 11.7–15.4)
RPR Ser Ql: NONREACTIVE
Rh Factor: POSITIVE
Rubella Antibodies, IGG: <0.9 index — ABNORMAL LOW (ref >0.99)
Varicella zoster IgG: 1783 index (ref >165)
WBC: 6 10*3/uL (ref 3.4–10.8)

## 2020-01-28 NOTE — Progress Notes (Signed)
Patient does not have mychart. Please. Discuss results with patient at her visit next week. Thank you.

## 2020-01-29 LAB — MONITOR DRUG PROFILE 10(MW)
Amphetamine Scrn, Ur: NEGATIVE ng/mL
BARBITURATE SCREEN URINE: NEGATIVE ng/mL
BENZODIAZEPINE SCREEN, URINE: NEGATIVE ng/mL
CANNABINOIDS UR QL SCN: NEGATIVE ng/mL
Cocaine (Metab) Scrn, Ur: NEGATIVE ng/mL
Creatinine(Crt), U: 53.2 mg/dL (ref 20.0–300.0)
Methadone Screen, Urine: NEGATIVE ng/mL
OXYCODONE+OXYMORPHONE UR QL SCN: NEGATIVE ng/mL
Opiate Scrn, Ur: NEGATIVE ng/mL
Ph of Urine: 6.6 (ref 4.5–8.9)
Phencyclidine Qn, Ur: NEGATIVE ng/mL
Propoxyphene Scrn, Ur: NEGATIVE ng/mL

## 2020-01-30 LAB — URINE CULTURE: Organism ID, Bacteria: NO GROWTH

## 2020-02-03 ENCOUNTER — Ambulatory Visit (INDEPENDENT_AMBULATORY_CARE_PROVIDER_SITE_OTHER): Payer: 59 | Admitting: Certified Nurse Midwife

## 2020-02-03 ENCOUNTER — Other Ambulatory Visit: Payer: Self-pay

## 2020-02-03 ENCOUNTER — Ambulatory Visit (INDEPENDENT_AMBULATORY_CARE_PROVIDER_SITE_OTHER): Payer: 59

## 2020-02-03 ENCOUNTER — Other Ambulatory Visit (HOSPITAL_COMMUNITY)
Admission: RE | Admit: 2020-02-03 | Discharge: 2020-02-03 | Disposition: A | Discharge status: Home / Self Care | Payer: 59 | Source: Ambulatory Visit | Attending: Certified Nurse Midwife | Admitting: Certified Nurse Midwife

## 2020-02-03 VITALS — BP 120/80 | Wt 146.0 lb

## 2020-02-03 DIAGNOSIS — Z124 Encounter for screening for malignant neoplasm of cervix: Secondary | ICD-10-CM | POA: Insufficient documentation

## 2020-02-03 DIAGNOSIS — Z3401 Encounter for supervision of normal first pregnancy, first trimester: Secondary | ICD-10-CM

## 2020-02-03 DIAGNOSIS — F3281 Premenstrual dysphoric disorder: Secondary | ICD-10-CM | POA: Insufficient documentation

## 2020-02-03 DIAGNOSIS — Z113 Encounter for screening for infections with a predominantly sexual mode of transmission: Secondary | ICD-10-CM | POA: Insufficient documentation

## 2020-02-03 DIAGNOSIS — O210 Mild hyperemesis gravidarum: Secondary | ICD-10-CM

## 2020-02-03 DIAGNOSIS — Z3A09 9 weeks gestation of pregnancy: Secondary | ICD-10-CM

## 2020-02-03 DIAGNOSIS — Z3A08 8 weeks gestation of pregnancy: Secondary | ICD-10-CM | POA: Insufficient documentation

## 2020-02-03 DIAGNOSIS — N920 Excessive and frequent menstruation with regular cycle: Secondary | ICD-10-CM | POA: Insufficient documentation

## 2020-02-03 MED ORDER — ONDANSETRON 4 MG PO TBDP
4.0000 mg | ORAL_TABLET | Freq: Four times a day (QID) | ORAL | 0 refills | Status: DC | PRN
Start: 2020-02-03 — End: 2020-02-16

## 2020-02-03 NOTE — Progress Notes (Signed)
ROB/dating scan at 8wk5d: Nausea and vomiting less frequent by taking 4 mgm Zofran  ODT in AM and phenergan 25 mgm in PM.  Reviewed NOB lab results with patient. Recommend MMR after delivery since Rubella nonimmune Had AGUS pap in 2019 and NIL/neg HRHPV Pap in 09/2018  Dating scan today: CRL 9wk1d with FCA 176BMP Weight down 4# eating small frequent snacks  A: IUP at 8wk5d  P: PAP and cultures done Discussed genetic screening-she is considering options and will check with insurance to see if covered. RTO 2 weeks for DT check and MaterniT 21 if desires. Her insurance will only pay for 18 pills of Zofran every 21 days. Given a RX for 11 pills to last until she can get refills on 2 June. Discussed pros and cons of getting Covid vaccine.  Jean Gay, CNM

## 2020-02-08 ENCOUNTER — Other Ambulatory Visit: Payer: Self-pay

## 2020-02-08 ENCOUNTER — Emergency Department
Admission: EM | Admit: 2020-02-08 | Discharge: 2020-02-08 | Disposition: A | Discharge status: Home / Self Care | Payer: 59 | Attending: Emergency Medicine | Admitting: Emergency Medicine

## 2020-02-08 DIAGNOSIS — Z79899 Other long term (current) drug therapy: Secondary | ICD-10-CM | POA: Diagnosis not present

## 2020-02-08 DIAGNOSIS — Z3A09 9 weeks gestation of pregnancy: Secondary | ICD-10-CM | POA: Insufficient documentation

## 2020-02-08 DIAGNOSIS — O210 Mild hyperemesis gravidarum: Secondary | ICD-10-CM | POA: Diagnosis not present

## 2020-02-08 DIAGNOSIS — O219 Vomiting of pregnancy, unspecified: Secondary | ICD-10-CM | POA: Diagnosis present

## 2020-02-08 LAB — COMPREHENSIVE METABOLIC PANEL
ALT: 40 U/L (ref 0–44)
AST: 23 U/L (ref 15–41)
Albumin: 4.2 g/dL (ref 3.5–5.0)
Alkaline Phosphatase: 65 U/L (ref 38–126)
Anion gap: 11 (ref 5–15)
BUN: 9 mg/dL (ref 6–20)
CO2: 22 mmol/L (ref 22–32)
Calcium: 9.3 mg/dL (ref 8.9–10.3)
Chloride: 103 mmol/L (ref 98–111)
Creatinine, Ser: 0.69 mg/dL (ref 0.44–1.00)
GFR calc Af Amer: >60 mL/min (ref >60)
GFR calc non Af Amer: >60 mL/min (ref >60)
Glucose, Bld: 86 mg/dL (ref 70–99)
Potassium: 3.7 mmol/L (ref 3.5–5.1)
Sodium: 136 mmol/L (ref 135–145)
Total Bilirubin: 0.6 mg/dL (ref 0.3–1.2)
Total Protein: 7.8 g/dL (ref 6.5–8.1)

## 2020-02-08 LAB — CYTOLOGY - PAP
Chlamydia: NEGATIVE
Comment: NEGATIVE
Comment: NEGATIVE
Comment: NORMAL
Diagnosis: NEGATIVE
Neisseria Gonorrhea: NEGATIVE
Trichomonas: NEGATIVE

## 2020-02-08 LAB — URINALYSIS, COMPLETE (UACMP) WITH MICROSCOPIC
Bilirubin Urine: NEGATIVE
Glucose, UA: NEGATIVE mg/dL
Hgb urine dipstick: NEGATIVE
Ketones, ur: 20 mg/dL — AB
Leukocytes,Ua: NEGATIVE
Nitrite: NEGATIVE
Protein, ur: NEGATIVE mg/dL
Specific Gravity, Urine: 1.018 (ref 1.005–1.030)
pH: 6 (ref 5.0–8.0)

## 2020-02-08 LAB — CBC
HCT: 32 % — ABNORMAL LOW (ref 36.0–46.0)
Hemoglobin: 11.7 g/dL — ABNORMAL LOW (ref 12.0–15.0)
MCH: 32.1 pg (ref 26.0–34.0)
MCHC: 36.6 g/dL — ABNORMAL HIGH (ref 30.0–36.0)
MCV: 87.9 fL (ref 80.0–100.0)
Platelets: 211 10*3/uL (ref 150–400)
RBC: 3.64 MIL/uL — ABNORMAL LOW (ref 3.87–5.11)
RDW: 11.7 % (ref 11.5–15.5)
WBC: 5.3 10*3/uL (ref 4.0–10.5)
nRBC: 0 % (ref 0.0–0.2)

## 2020-02-08 LAB — LIPASE, BLOOD: Lipase: 34 U/L (ref 11–51)

## 2020-02-08 MED ORDER — SODIUM CHLORIDE 0.9 % IV BOLUS
1000.0000 mL | Freq: Once | INTRAVENOUS | Status: AC
  Administered 2020-02-08: 1000 mL via INTRAVENOUS

## 2020-02-08 MED ORDER — METOCLOPRAMIDE HCL 5 MG/ML IJ SOLN
10.0000 mg | Freq: Once | INTRAMUSCULAR | Status: AC
  Administered 2020-02-08: 10 mg via INTRAVENOUS
  Filled 2020-02-08: qty 2

## 2020-02-08 MED ORDER — SODIUM CHLORIDE 0.9% FLUSH: 3.0000 mL | Freq: Once | INTRAVENOUS | Status: DC

## 2020-02-08 MED ORDER — METOCLOPRAMIDE HCL 10 MG PO TABS
10.0000 mg | ORAL_TABLET | Freq: Four times a day (QID) | ORAL | 0 refills | Status: DC | PRN
Start: 2020-02-08 — End: 2020-03-15

## 2020-02-08 NOTE — ED Notes (Signed)
NAD noted at time of D/C. Pt denies questions or concerns. Pt ambulatory to the lobby at this time.  

## 2020-02-08 NOTE — ED Provider Notes (Signed)
[facility] Emergency Department Provider Note  Time seen: 3:09 PM  I have reviewed the triage vital signs and the nursing notes.   HISTORY  Chief Complaint Emesis During Pregnancy   HPI Jean Gay is a 29 y.o. female approximately [redacted] weeks pregnant who presents to the emergency department for nausea vomiting.  According to the patient throughout this pregnancy she has been very nauseated with frequent episodes of vomiting.  Currently taking oral Zofran, ODT Zofran and Phenergan at home.  Patient states she has not had relief of the nausea today has not been able to keep down fluids so she came to the emergency department.  Patient denies any abdominal pain denies any vaginal bleeding or fluid leakage.  Patient follows up with [facility].   History reviewed. No pertinent past medical history.  Patient Active Problem List   Diagnosis Date Noted  . Menorrhagia 02/03/2020  . PMDD (premenstrual dysphoric disorder) 02/03/2020  . Encounter for supervision of normal first pregnancy in first trimester 01/27/2020  . Venomous snake bite 03/11/2017    History reviewed. No pertinent surgical history.  Prior to Admission medications   Medication Sig Start Date End Date Taking? Authorizing Provider  docusate sodium (COLACE) 100 MG capsule Take 1 capsule (100 mg total) by mouth 2 (two) times daily as needed. 01/27/20   Schuman, Christanna R, MD  ondansetron (ZOFRAN ODT) 4 MG disintegrating tablet Take 1 tablet (4 mg total) by mouth every 6 (six) hours as needed for nausea. 01/27/20   Schuman, Christanna R, MD  ondansetron (ZOFRAN ODT) 4 MG disintegrating tablet Take 1 tablet (4 mg total) by mouth every 6 (six) hours as needed for nausea. 02/03/20   Dalia Heading, CNM  polyethylene glycol (MIRALAX) 17 g packet Take 17 g by mouth daily. 01/27/20   Schuman, Stefanie Libel, MD  promethazine (PHENERGAN) 25 MG tablet Take 1 tablet (25 mg total) by mouth every 6 (six) hours  as needed for nausea or vomiting. 01/27/20   Schuman, Stefanie Libel, MD    Allergies  Allergen Reactions  . Oseltamivir     Dizzy Dizzy     Family History  Problem Relation Age of Onset  . Diabetes Maternal Grandfather   . Hypertension Maternal Grandfather   . Heart disease Maternal Grandfather     Social History Social History   Tobacco Use  . Smoking status: Never Smoker  . Smokeless tobacco: Never Used  Substance Use Topics  . Alcohol use: Not Currently  . Drug use: Never    Review of Systems Constitutional: Negative for fever Cardiovascular: Negative for chest pain. Respiratory: Negative for shortness of breath. Gastrointestinal: Negative for abdominal pain.  Positive for nausea vomiting. Genitourinary: Negative for urinary compaints Musculoskeletal: Negative for musculoskeletal complaints Neurological: Negative for headache All other ROS negative  ____________________________________________   PHYSICAL EXAM:  VITAL SIGNS: ED Triage Vitals [02/08/20 1232]  Enc Vitals Group     BP 127/82     Pulse Rate (!) 108     Resp 18     Temp 99.2 F (37.3 C)     Temp Source Oral     SpO2 99 %     Weight 146 lb (66.2 kg)     Height 5\' 5"  (1.651 m)     Head Circumference      Peak Flow      Pain Score 0     Pain Loc      Pain Edu?  Excl. in GC?     Constitutional: Alert and oriented. Well appearing and in no distress. Eyes: Normal exam ENT      Head: Normocephalic and atraumatic.      Mouth/Throat: Mucous membranes are moist. Cardiovascular: Normal rate, regular rhythm.  Respiratory: Normal respiratory effort without tachypnea nor retractions. Breath sounds are clear  Gastrointestinal: Soft and nontender. No distention. Musculoskeletal: Nontender with normal range of motion in all extremities.  Neurologic:  Normal speech and language. No gross focal neurologic deficits Skin:  Skin is warm, dry and intact.  Psychiatric: Mood and affect are normal.    ____________________________________________   INITIAL IMPRESSION / ASSESSMENT AND PLAN / ED COURSE  Pertinent labs & imaging results that were available during my care of the patient were reviewed by me and considered in my medical decision making (see chart for details).   Patient presents to the emergency department for nausea vomiting, approximately [redacted] weeks pregnant.  No abdominal pain vaginal bleeding or fluid leakage.  Nontender abdomen.  Patient's labs largely at baseline besides ketones within the urine suggesting likely dehydration.  We will start an IV, dose IV Reglan as well as 2 L of IV fluids and reassess.  Patient agreeable to plan of care.  Patient's work-up is overall reassuring.  Patient's lab work within normal limits.  Patient states she is feeling quite a bit better after Reglan.  Finishing her second liter of fluids right now.  We will discharge with Reglan.  I cautioned the patient to only take one of the nausea medications.  She states the Reglan seemed to work best so far.  Jean Gay was evaluated in Emergency Department on 02/08/2020 for the symptoms described in the history of present illness. She was evaluated in the context of the global COVID-19 pandemic, which necessitated consideration that the patient might be at risk for infection with the SARS-CoV-2 virus that causes COVID-19. Institutional protocols and algorithms that pertain to the evaluation of patients at risk for COVID-19 are in a state of rapid change based on information released by regulatory bodies including the CDC and federal and state organizations. These policies and algorithms were followed during the patient's care in the ED.  ____________________________________________   FINAL CLINICAL IMPRESSION(S) / ED DIAGNOSES  Hyperemesis gravidarum   Minna Antis, MD 02/08/20 1701

## 2020-02-08 NOTE — ED Triage Notes (Signed)
Pt c/o hyperemesis with pregnancy, states she is 9 weeks and has been having issues with vomiting during the whole pregnancy and is currently taking 2 antinausea meds with no relief.

## 2020-02-16 ENCOUNTER — Ambulatory Visit (INDEPENDENT_AMBULATORY_CARE_PROVIDER_SITE_OTHER): Payer: 59 | Admitting: Certified Nurse Midwife

## 2020-02-16 ENCOUNTER — Other Ambulatory Visit: Payer: Self-pay

## 2020-02-16 VITALS — BP 99/68 | Wt 143.4 lb

## 2020-02-16 DIAGNOSIS — Z3401 Encounter for supervision of normal first pregnancy, first trimester: Secondary | ICD-10-CM

## 2020-02-16 DIAGNOSIS — Z3A10 10 weeks gestation of pregnancy: Secondary | ICD-10-CM

## 2020-02-16 DIAGNOSIS — Z1379 Encounter for other screening for genetic and chromosomal anomalies: Secondary | ICD-10-CM

## 2020-02-16 LAB — POCT URINALYSIS DIPSTICK OB
Glucose, UA: NEGATIVE
POC,PROTEIN,UA: NEGATIVE

## 2020-02-18 ENCOUNTER — Encounter: Payer: Self-pay | Admitting: Certified Nurse Midwife

## 2020-02-18 NOTE — Progress Notes (Signed)
ROB at 10wk4d: Seen in ER about a week after her last visit for worsening nausea and vomiting. Was prescribed Reglan with relief of symptoms. Has not needed antiemetics the last 3 days. Starting to feel better.Desires MaterniT 21 testing.  FHT 163 today with DT  A: IUP at 10wk4d  P: MaterniT 21 today ROB in 4 weeks Reglan as needed. Continue frequent small meals/snacks.  Farrel Conners, CNM

## 2020-02-20 LAB — MATERNIT 21 PLUS CORE, BLOOD
Fetal Fraction: 9
Result (T21): NEGATIVE
Trisomy 13 (Patau syndrome): NEGATIVE
Trisomy 18 (Edwards syndrome): NEGATIVE
Trisomy 21 (Down syndrome): NEGATIVE

## 2020-03-15 ENCOUNTER — Ambulatory Visit (INDEPENDENT_AMBULATORY_CARE_PROVIDER_SITE_OTHER): Payer: 59 | Admitting: Certified Nurse Midwife

## 2020-03-15 ENCOUNTER — Other Ambulatory Visit: Payer: Self-pay

## 2020-03-15 VITALS — BP 80/60 | Wt 144.0 lb

## 2020-03-15 DIAGNOSIS — Z3A14 14 weeks gestation of pregnancy: Secondary | ICD-10-CM

## 2020-03-15 DIAGNOSIS — Z3402 Encounter for supervision of normal first pregnancy, second trimester: Secondary | ICD-10-CM

## 2020-03-15 DIAGNOSIS — Z363 Encounter for antenatal screening for malformations: Secondary | ICD-10-CM

## 2020-03-15 LAB — POCT URINALYSIS DIPSTICK OB
Glucose, UA: NEGATIVE
POC,PROTEIN,UA: NEGATIVE

## 2020-03-15 NOTE — Progress Notes (Signed)
ROB- no concerns 

## 2020-03-16 NOTE — Progress Notes (Signed)
ROB at 14wk4d: Less nausea and vomiting. Able to keep down most of what she eats. Denies lightheadedness unless she gets up quickly. Drinking about 1/2 gallon of fluid daily  MaterniT 21: diploid XY. Desires MSAFP testing  Exam: BP 80/60. Weight up 1# to 144#. Negative proteinuria. FHT 143. FH at U-2  A: IUP at 14wk4d Hypotension in second trimester due to vasodilation R/T pregnancy  P: RTO in 2 weeks for ROB and MSAFP Encourage increased fluid intake Safety precautions discussed Anatomy scan in 4-5 weeks/ Farrel Conners, CNM

## 2020-03-29 ENCOUNTER — Ambulatory Visit (INDEPENDENT_AMBULATORY_CARE_PROVIDER_SITE_OTHER): Payer: 59 | Admitting: Certified Nurse Midwife

## 2020-03-29 ENCOUNTER — Other Ambulatory Visit: Payer: Self-pay

## 2020-03-29 VITALS — BP 100/80 | Wt 147.0 lb

## 2020-03-29 DIAGNOSIS — Z3402 Encounter for supervision of normal first pregnancy, second trimester: Secondary | ICD-10-CM

## 2020-03-29 DIAGNOSIS — Z1379 Encounter for other screening for genetic and chromosomal anomalies: Secondary | ICD-10-CM

## 2020-03-29 DIAGNOSIS — Z3A16 16 weeks gestation of pregnancy: Secondary | ICD-10-CM

## 2020-03-29 LAB — POCT URINALYSIS DIPSTICK OB
Glucose, UA: NEGATIVE
POC,PROTEIN,UA: NEGATIVE

## 2020-03-29 NOTE — Progress Notes (Signed)
ROB at 16wk4d: Feeling better. Has gained 3#. Not feeling lightheaded. Having some shooting pains from right buttock and radiating down right leg. Has been applying ICY HOT patches with some relief. Desires MSAFP  Exam: BP 100/80. FH at U-2-3 FB. FHT 154.   A: IUP at 16wk4d Possible sciatic pain on right  P: Discussed using good body mechanics Recommend stopping ICY Hot as it has a salicylate base. Recommended Biofreeze or Capsazin instead, heat or ice, and stretching exercises. MSAFP today Anatomy scan and ROB as scheduled  Farrel Conners, CNM

## 2020-03-29 NOTE — Progress Notes (Signed)
No concerns.rj 

## 2020-03-31 ENCOUNTER — Encounter: Payer: Self-pay | Admitting: Certified Nurse Midwife

## 2020-03-31 LAB — AFP, SERUM, OPEN SPINA BIFIDA
AFP MoM: 1.95
AFP Value: 73.5 ng/mL
Gest. Age on Collection Date: 16.6 weeks
Maternal Age At EDD: 28.5 yr
OSBR Risk 1 IN: 901
Test Results:: NEGATIVE
Weight: 147 [lb_av]

## 2020-04-13 ENCOUNTER — Other Ambulatory Visit: Payer: Self-pay

## 2020-04-13 ENCOUNTER — Ambulatory Visit (INDEPENDENT_AMBULATORY_CARE_PROVIDER_SITE_OTHER): Payer: 59 | Admitting: Certified Nurse Midwife

## 2020-04-13 ENCOUNTER — Ambulatory Visit (INDEPENDENT_AMBULATORY_CARE_PROVIDER_SITE_OTHER): Payer: 59

## 2020-04-13 VITALS — BP 100/60 | Wt 149.0 lb

## 2020-04-13 DIAGNOSIS — Z3A19 19 weeks gestation of pregnancy: Secondary | ICD-10-CM | POA: Diagnosis not present

## 2020-04-13 DIAGNOSIS — Z363 Encounter for antenatal screening for malformations: Secondary | ICD-10-CM

## 2020-04-13 DIAGNOSIS — Z3401 Encounter for supervision of normal first pregnancy, first trimester: Secondary | ICD-10-CM

## 2020-04-13 DIAGNOSIS — M545 Low back pain, unspecified: Secondary | ICD-10-CM

## 2020-04-13 DIAGNOSIS — Z3402 Encounter for supervision of normal first pregnancy, second trimester: Secondary | ICD-10-CM

## 2020-04-13 DIAGNOSIS — Z3A18 18 weeks gestation of pregnancy: Secondary | ICD-10-CM

## 2020-04-13 DIAGNOSIS — R8290 Unspecified abnormal findings in urine: Secondary | ICD-10-CM

## 2020-04-13 LAB — POCT URINALYSIS DIPSTICK
Bilirubin, UA: NEGATIVE
Blood, UA: NEGATIVE
Glucose, UA: NEGATIVE
Ketones, UA: NEGATIVE
Nitrite, UA: NEGATIVE
Protein, UA: NEGATIVE
Spec Grav, UA: 1.01 (ref 1.010–1.025)
Urobilinogen, UA: NEGATIVE E.U./dL — AB
pH, UA: 6.5 (ref 5.0–8.0)

## 2020-04-13 LAB — POCT URINALYSIS DIPSTICK OB
Glucose, UA: NEGATIVE
POC,PROTEIN,UA: NEGATIVE

## 2020-04-13 MED ORDER — CEPHALEXIN 500 MG PO CAPS
500.0000 mg | ORAL_CAPSULE | Freq: Three times a day (TID) | ORAL | 2 refills | Status: DC
Start: 2020-04-13 — End: 2020-04-13

## 2020-04-13 MED ORDER — CEPHALEXIN 500 MG PO CAPS: 500.0000 mg | ORAL_CAPSULE | Freq: Three times a day (TID) | ORAL | 0 refills | Status: DC

## 2020-04-13 NOTE — Progress Notes (Signed)
C/o still throws up sometimes; nausea comes and goes; yesterday urine was cloudy and lower back hurt; feels better today.rj

## 2020-04-15 LAB — URINE CULTURE: Organism ID, Bacteria: NO GROWTH

## 2020-04-25 NOTE — Progress Notes (Signed)
ROB and anatomy scan at 18wk5d: Complains of lower back pain and cloudy urine x 1 day. No dysuria. Feeling fetal movement. Nausea and vomiting intermittently, but has gained 2 # over the last 2 weeks.   CGA 19wk3d, anterior placenta, female gender, normal anatomy  Urine dipstick: trace leukocytes, sp gravity 1.010  A: IUP at 18wk5d S=D Possible UTI  P: urine culture. Keflex 500 mgm tid while awaiting urine culture results ROB in 4 weeks and prn.  Farrel Conners, CNM

## 2020-05-11 ENCOUNTER — Other Ambulatory Visit: Payer: Self-pay

## 2020-05-11 ENCOUNTER — Ambulatory Visit (INDEPENDENT_AMBULATORY_CARE_PROVIDER_SITE_OTHER): Payer: 59 | Admitting: Obstetrics

## 2020-05-11 VITALS — BP 112/70 | Ht 65.0 in | Wt 156.8 lb

## 2020-05-11 DIAGNOSIS — Z3A22 22 weeks gestation of pregnancy: Secondary | ICD-10-CM

## 2020-05-11 DIAGNOSIS — Z3402 Encounter for supervision of normal first pregnancy, second trimester: Secondary | ICD-10-CM

## 2020-05-11 DIAGNOSIS — O210 Mild hyperemesis gravidarum: Secondary | ICD-10-CM

## 2020-05-11 MED ORDER — ONDANSETRON 4 MG PO TBDP: 4.0000 mg | ORAL_TABLET | Freq: Four times a day (QID) | ORAL | 0 refills | Status: DC | PRN

## 2020-05-11 NOTE — Progress Notes (Signed)
  Routine Prenatal Care Visit  Subjective  Jean Gay is a 29 y.o. G1P0 at [redacted]w[redacted]d being seen today for ongoing prenatal care.  She is currently monitored for the following issues for this low-risk pregnancy and has Supervision of other normal pregnancy, antepartum; Menorrhagia; and PMDD (premenstrual dysphoric disorder) on their problem list.  ----------------------------------------------------------------------------------- Patient reports nausea.  She continues to have several episodes of N/V weekly, and is asking for more medications to address the vomiting.  . Vag. Bleeding: None.  Movement: Present. Leaking Fluid denies.  ----------------------------------------------------------------------------------- The following portions of the patient's history were reviewed and updated as appropriate: allergies, current medications, past family history, past medical history, past social history, past surgical history and problem list. Problem list updated.  Objective  Blood pressure 112/70, height 5\' 5"  (1.651 m), weight 156 lb 12.8 oz (71.1 kg), last menstrual period 12/04/2019. Pregravid weight 150 lb (68 kg) Total Weight Gain 6 lb 12.8 oz (3.084 kg) Urinalysis: Urine Protein    Urine Glucose    Fetal Status:     Movement: Present     General:  Alert, oriented and cooperative. Patient is in no acute distress.  Skin: Skin is warm and dry. No rash noted.   Cardiovascular: Normal heart rate noted  Respiratory: Normal respiratory effort, no problems with respiration noted  Abdomen: Soft, gravid, appropriate for gestational age. Pain/Pressure: Absent     Pelvic:  Cervical exam deferred        Extremities: Normal range of motion.     Mental Status: Normal mood and affect. Normal behavior. Normal judgment and thought content.   Assessment   29 y.o. G1P0 at [redacted]w[redacted]d by  09/09/2020, by Last Menstrual Period presenting for routine prenatal visit  Plan   Pregnancy #1 Problems (from 01/27/20 to  present)    Problem Noted Resolved   Supervision of other normal pregnancy, antepartum 01/27/2020 by 03/28/2020, MD No   Overview Addendum 03/31/2020 11:42 AM by 04/02/2020, CNM     Nursing Staff Provider  Office Location  [facility] Dating  LMP. [facility] 09/09/2020  Language  English Anatomy 09/11/2020    Flu Vaccine   Genetic Screen  NIPS: diploid XY   AFP: negative    TDaP vaccine    Hgb A1C or  GTT Early : Third trimester :   Rhogam     LAB RESULTS   Feeding Plan  Blood Type O/Positive/-- (05/12 1431)   Contraception  Antibody Negative (05/12 1431)  Circumcision  Rubella <0.90 (05/12 1431)  Pediatrician   RPR Non Reactive (05/12 1431)   Support Person  HBsAg Negative (05/12 1431)   Prenatal Classes  HIV Non Reactive (05/12 1431)    Varicella  Immune  BTL Consent  GBS  (For PCN allergy, check sensitivities)        VBAC Consent  Pap      Hgb Electro      CF      SMA               Previous Version       Preterm labor symptoms and general obstetric precautions including but not limited to vaginal bleeding, contractions, leaking of fluid and fetal movement were reviewed in detail with the patient. Please refer to After Visit Summary for other counseling recommendations. Discussed rx for Zofran ODT- sent to pharmacy.  Return in about 4 weeks (around 06/08/2020) for return OB.  06/10/2020, CNM  05/11/2020 8:21 AM

## 2020-05-11 NOTE — Addendum Note (Signed)
Addended by: Clement Husbands A on: 05/11/2020 09:20 AM   Modules accepted: Orders

## 2020-06-06 ENCOUNTER — Other Ambulatory Visit: Payer: Self-pay

## 2020-06-06 ENCOUNTER — Ambulatory Visit (INDEPENDENT_AMBULATORY_CARE_PROVIDER_SITE_OTHER): Payer: 59 | Admitting: Obstetrics

## 2020-06-06 VITALS — BP 100/70 | Wt 163.0 lb

## 2020-06-06 DIAGNOSIS — Z3480 Encounter for supervision of other normal pregnancy, unspecified trimester: Secondary | ICD-10-CM

## 2020-06-06 DIAGNOSIS — Z3A26 26 weeks gestation of pregnancy: Secondary | ICD-10-CM

## 2020-06-06 LAB — POCT URINALYSIS DIPSTICK OB
Glucose, UA: NEGATIVE
POC,PROTEIN,UA: NEGATIVE

## 2020-06-06 NOTE — Addendum Note (Signed)
Addended by: Mirna Mires on: 06/06/2020 10:12 AM   Modules accepted: Orders

## 2020-06-06 NOTE — Progress Notes (Signed)
  Routine Prenatal Care Visit  Subjective  Jean Gay is a 29 y.o. G1P0 at [redacted]w[redacted]d being seen today for ongoing prenatal care.  She is currently monitored for the following issues for this low-risk pregnancy and has Supervision of other normal pregnancy, antepartum; Menorrhagia; and PMDD (premenstrual dysphoric disorder) on their problem list.  ----------------------------------------------------------------------------------- Patient reports backache, no contractions, no cramping and no leaking.    .  .   Pincus Large Fluid denies.  ----------------------------------------------------------------------------------- The following portions of the patient's history were reviewed and updated as appropriate: allergies, current medications, past family history, past medical history, past social history, past surgical history and problem list. Problem list updated.  Objective  Blood pressure 100/70, weight 163 lb (73.9 kg), last menstrual period 12/04/2019. Pregravid weight 150 lb (68 kg) Total Weight Gain 13 lb (5.897 kg) Urinalysis: Urine Protein    Urine Glucose    Fetal Status:           General:  Alert, oriented and cooperative. Patient is in no acute distress.  Skin: Skin is warm and dry. No rash noted.   Cardiovascular: Normal heart rate noted  Respiratory: Normal respiratory effort, no problems with respiration noted  Abdomen: Soft, gravid, appropriate for gestational age.       Pelvic:  Cervical exam deferred        Extremities: Normal range of motion.     Mental Status: Normal mood and affect. Normal behavior. Normal judgment and thought content.   Assessment   29 y.o. G1P0 at [redacted]w[redacted]d by  09/09/2020, by Last Menstrual Period presenting for routine prenatal visit  Plan   Pregnancy #1 Problems (from 01/27/20 to present)    Problem Noted Resolved   Supervision of other normal pregnancy, antepartum 01/27/2020 by Natale Milch, MD No   Overview Addendum 06/06/2020  8:18 AM by  Mirna Mires, CNM     Nursing Staff Provider  Office Location  [facility] Dating  LMP. [facility] 09/09/2020  Language  English Anatomy US    Flu Vaccine   Genetic Screen  NIPS: diploid XY   AFP: negative    TDaP vaccine    Hgb A1C or  GTT Early : Third trimester : ordered  Rhogam     LAB RESULTS   Feeding Plan  Blood Type O/Positive/-- (05/12 1431)   Contraception  Antibody Negative (05/12 1431)  Circumcision  Rubella <0.90 (05/12 1431)  Pediatrician   RPR Non Reactive (05/12 1431)   Support Person  HBsAg Negative (05/12 1431)   Prenatal Classes  HIV Non Reactive (05/12 1431)    Varicella  Immune  BTL Consent  GBS  (For PCN allergy, check sensitivities)        VBAC Consent  Pap      Hgb Electro      CF      SMA               Previous Version       Preterm labor symptoms and general obstetric precautions including but not limited to vaginal bleeding, contractions, leaking of fluid and fetal movement were reviewed in detail with the patient. Please refer to After Visit Summary for other counseling recommendations.  Described back exericses to alleviate the back strain and some sciatic issues. Discussed the 28 week labs.  Return in about 2 weeks (around 06/20/2020) for return OB.  Mirna Mires, CNM  06/06/2020 8:22 AM

## 2020-06-06 NOTE — Addendum Note (Signed)
Addended by: Loran Senters D on: 06/06/2020 08:44 AM   Modules accepted: Orders

## 2020-06-06 NOTE — Progress Notes (Signed)
C/o back pain; please explain back stretches to pt as baby fight the belly band. rj

## 2020-06-20 ENCOUNTER — Other Ambulatory Visit: Payer: 59

## 2020-06-20 ENCOUNTER — Other Ambulatory Visit: Payer: Self-pay

## 2020-06-20 ENCOUNTER — Ambulatory Visit (INDEPENDENT_AMBULATORY_CARE_PROVIDER_SITE_OTHER): Payer: 59 | Admitting: Obstetrics

## 2020-06-20 VITALS — BP 110/80 | Wt 169.0 lb

## 2020-06-20 DIAGNOSIS — Z3A28 28 weeks gestation of pregnancy: Secondary | ICD-10-CM

## 2020-06-20 DIAGNOSIS — Z3480 Encounter for supervision of other normal pregnancy, unspecified trimester: Secondary | ICD-10-CM

## 2020-06-20 DIAGNOSIS — R000 Tachycardia, unspecified: Secondary | ICD-10-CM

## 2020-06-20 LAB — POCT URINALYSIS DIPSTICK OB
Glucose, UA: NEGATIVE
POC,PROTEIN,UA: NEGATIVE

## 2020-06-20 NOTE — Progress Notes (Signed)
Routine Prenatal Care Visit  Subjective  Jean Gay is a 29 y.o. G1P0 at [redacted]w[redacted]d being seen today for ongoing prenatal care.  She is currently monitored for the following issues for this low-risk pregnancy and has Supervision of other normal pregnancy, antepartum; Menorrhagia; and PMDD (premenstrual dysphoric disorder) on their problem list.  ----------------------------------------------------------------------------------- Patient reports occasions when her heart is "racing". She feels SOB at these times. No fainting. Baby is moving well. She is "tired of the pregnancy, and wants him out".    .  .   Pincus Large Fluid denies.  ----------------------------------------------------------------------------------- The following portions of the patient's history were reviewed and updated as appropriate: allergies, current medications, past family history, past medical history, past social history, past surgical history and problem list. Problem list updated.  Objective  Blood pressure 110/80, weight 169 lb (76.7 kg), last menstrual period 12/04/2019. Pregravid weight 150 lb (68 kg) Total Weight Gain 19 lb (8.618 kg) Urinalysis: Urine Protein Negative  Urine Glucose Negative  Fetal Status:           General:  Alert, oriented and cooperative. Patient is in no acute distress.  Skin: Skin is warm and dry. No rash noted.   Cardiovascular: Normal heart rate noted  Respiratory: Normal respiratory effort, no problems with respiration noted  Abdomen: Soft, gravid, appropriate for gestational age.       Pelvic:  Cervical exam deferred        Extremities: Normal range of motion.     Mental Status: Normal mood and affect. Normal behavior. Normal judgment and thought content.   Assessment   29 y.o. G1P0 at [redacted]w[redacted]d by  09/09/2020, by Last Menstrual Period presenting for routine prenatal visit  Plan   Pregnancy #1 Problems (from 01/27/20 to present)    Problem Noted Resolved   Supervision of other  normal pregnancy, antepartum 01/27/2020 by Natale Milch, MD No   Overview Addendum 06/06/2020  8:18 AM by Mirna Mires, CNM     Nursing Staff Provider  Office Location  [facility] Dating  LMP. [facility] 09/09/2020  Language  English Anatomy US    Flu Vaccine   Genetic Screen  NIPS: diploid XY   AFP: negative    TDaP vaccine    Hgb A1C or  GTT Early : Third trimester : ordered  Rhogam     LAB RESULTS   Feeding Plan  Blood Type O/Positive/-- (05/12 1431)   Contraception  Antibody Negative (05/12 1431)  Circumcision  Rubella <0.90 (05/12 1431)  Pediatrician   RPR Non Reactive (05/12 1431)   Support Person  HBsAg Negative (05/12 1431)   Prenatal Classes  HIV Non Reactive (05/12 1431)    Varicella  Immune  BTL Consent  GBS  (For PCN allergy, check sensitivities)        VBAC Consent  Pap      Hgb Electro      CF      SMA               Previous Version       Preterm labor symptoms and general obstetric precautions including but not limited to vaginal bleeding, contractions, leaking of fluid and fetal movement were reviewed in detail with the patient. Please refer to After Visit Summary for other counseling recommendations.  28 week labs drawn today. Will refer to cardiology for consult and monitoring. Return in about 2 weeks (around 07/04/2020) for return OB.  Mirna Mires, CNM  06/20/2020 9:40 AM

## 2020-06-20 NOTE — Progress Notes (Signed)
C/o uncomfortable; on several days is unable to get HR below 100. rj

## 2020-06-21 LAB — 28 WEEK RH+PANEL
Basophils Absolute: 0 10*3/uL (ref 0.0–0.2)
Basos: 0 %
EOS (ABSOLUTE): 0.1 10*3/uL (ref 0.0–0.4)
Eos: 1 %
Gestational Diabetes Screen: 111 mg/dL (ref 65–139)
HIV Screen 4th Generation wRfx: NONREACTIVE
Hematocrit: 33.5 % — ABNORMAL LOW (ref 34.0–46.6)
Hemoglobin: 11.9 g/dL (ref 11.1–15.9)
Immature Grans (Abs): 0 10*3/uL (ref 0.0–0.1)
Immature Granulocytes: 0 %
Lymphocytes Absolute: 1.7 10*3/uL (ref 0.7–3.1)
Lymphs: 24 %
MCH: 33.1 pg — ABNORMAL HIGH (ref 26.6–33.0)
MCHC: 35.5 g/dL (ref 31.5–35.7)
MCV: 93 fL (ref 79–97)
Monocytes Absolute: 0.4 10*3/uL (ref 0.1–0.9)
Monocytes: 6 %
Neutrophils Absolute: 4.9 10*3/uL (ref 1.4–7.0)
Neutrophils: 69 %
Platelets: 209 10*3/uL (ref 150–450)
RBC: 3.59 x10E6/uL — ABNORMAL LOW (ref 3.77–5.28)
RDW: 11.9 % (ref 11.7–15.4)
RPR Ser Ql: NONREACTIVE
WBC: 7.1 10*3/uL (ref 3.4–10.8)

## 2020-06-30 ENCOUNTER — Other Ambulatory Visit: Payer: Self-pay

## 2020-06-30 ENCOUNTER — Encounter: Payer: Self-pay | Admitting: *Deleted

## 2020-06-30 ENCOUNTER — Ambulatory Visit: Payer: 59 | Admitting: Cardiology

## 2020-06-30 VITALS — BP 108/72 | HR 112 | Ht 63.0 in | Wt 169.0 lb

## 2020-06-30 DIAGNOSIS — R000 Tachycardia, unspecified: Secondary | ICD-10-CM

## 2020-06-30 NOTE — Progress Notes (Signed)
Electrophysiology Office Note:    Date:  06/30/2020   ID:  [name], DOB September 20, 1990, MRN [id number]  PCP:  [facility]  [facility] Cardiologist:  No primary care provider on file.  [facility] Electrophysiologist:  Lanier Prude, MD   Referring MD: Mirna Mires, CNM   Chief Complaint: Tachycardia  History of Present Illness:    Jean Gay is a 29 y.o. female who presents for an evaluation of tachycardia at the request of Paula Compton, CNM. Their medical history includes current pregnancy (G1, P0).  She was last seen by Paula Compton on June 20, 2020.  During that appointment she reported feeling her heart racing intermittently.  During these episodes she feels short of breath. She has felt her heart rate has increased gradually during the course of the pregnancy but she describes discrete episodes of a sudden increase in heart rate that can occur while she is at rest or while exerting herself.  These episodes last for minutes to an hour at a time and then resolve spontaneously.  No specific triggers that she is aware of.  She tries her best to stay hydrated but knows that some days she does not drink enough water.  She works as a Librarian, academic for a [employer]  Her pregnancy has been otherwise uncomplicated.  Past Medical History:  Diagnosis Date  . Venomous snake bite 03/11/2017    No past surgical history on file.  Current Medications: No outpatient medications have been marked as taking for the 06/30/20 encounter (Office Visit) with Lanier Prude, MD.     Allergies:   Oseltamivir   Social History   Socioeconomic History  . Marital status: Legally Separated    Spouse name: Not on file  . Number of children: Not on file  . Years of education: Not on file  . Highest education level: Not on file  Occupational History  . Not on file  Tobacco Use  . Smoking status: Never Smoker  . Smokeless tobacco: Never Used  Vaping  Use  . Vaping Use: Never used  Substance and Sexual Activity  . Alcohol use: Not Currently  . Drug use: Never  . Sexual activity: Yes    Birth control/protection: None  Other Topics Concern  . Not on file  Social History Narrative  . Not on file   Social Determinants of Health   Financial Resource Strain:   . Difficulty of Paying Living Expenses: Not on file  Food Insecurity:   . Worried About Programme researcher, broadcasting/film/video in the Last Year: Not on file  . Ran Out of Food in the Last Year: Not on file  Transportation Needs:   . Lack of Transportation (Medical): Not on file  . Lack of Transportation (Non-Medical): Not on file  Physical Activity:   . Days of Exercise per Week: Not on file  . Minutes of Exercise per Session: Not on file  Stress:   . Feeling of Stress : Not on file  Social Connections:   . Frequency of Communication with Friends and Family: Not on file  . Frequency of Social Gatherings with Friends and Family: Not on file  . Attends Religious Services: Not on file  . Active Member of Clubs or Organizations: Not on file  . Attends Banker Meetings: Not on file  . Marital Status: Not on file     Family History: The patient's family history includes Diabetes in her maternal grandfather; Heart disease  in her maternal grandfather; Hypertension in her maternal grandfather.  ROS:   Please see the history of present illness.    All other systems reviewed and are negative.  EKGs/Labs/Other Studies Reviewed:    The following studies were reviewed today: Records  EKG:  The ekg ordered today demonstrates sinus tachycardia with a heart rate of 112 bpm.  No preexcitation.  Recent Labs: 02/08/2020: ALT 40; BUN 9; Creatinine, Ser 0.69; Potassium 3.7; Sodium 136 06/20/2020: Hemoglobin 11.9; Platelets 209  Recent Lipid Panel No results found for: CHOL, TRIG, HDL, CHOLHDL, VLDL, LDLCALC, LDLDIRECT  Physical Exam:    VS:  BP 108/72   Pulse (!) 112   Ht 5\' 3"  (1.6  m)   Wt 169 lb (76.7 kg)   LMP 12/04/2019 (Exact Date)   SpO2 97%   BMI 29.94 kg/m     Wt Readings from Last 3 Encounters:  06/30/20 169 lb (76.7 kg)  06/20/20 169 lb (76.7 kg)  06/06/20 163 lb (73.9 kg)    Heart rate decreased to 90 bpm during my exam  GEN:  Well nourished, well developed in no acute distress HEENT: Normal NECK: No JVD; No carotid bruits LYMPHATICS: No lymphadenopathy CARDIAC: RRR, no murmurs, rubs, gallops RESPIRATORY:  Clear to auscultation without rales, wheezing or rhonchi  ABDOMEN: Soft, non-tender, non-distended MUSCULOSKELETAL:  No edema; No deformity  SKIN: Warm and dry NEUROLOGIC:  Alert and oriented x 3 PSYCHIATRIC:  Normal affect   ASSESSMENT:    1. Tachycardia    PLAN:    In order of problems listed above:  1. Tachycardia I suspect her resting heart rate elevations are due to her pregnancy.  It is possible that her discrete episodes of more rapid heart rates are bursts of atrial tachycardia or SVT.  I have recommended we place a 7-day patch monitor to get a better idea of what these are.  I discussed the possibility of needing to use a medication such as metoprolol to help control her heart rate but told her my preference would be to avoid this if at all possible to avoid any impact on her baby.  We will base the decision to start any medications on the results of the patch monitor.  We will plan to involve her OB team if medications are necessary.  Medication Adjustments/Labs and Tests Ordered: Current medicines are reviewed at length with the patient today.  Concerns regarding medicines are outlined above.  Orders Placed This Encounter  Procedures  . LONG TERM MONITOR (3-14 DAYS)  . EKG 12-Lead   No orders of the defined types were placed in this encounter.    Signed, 06/08/20, MD, [facility]  06/30/2020 3:52 PM    Electrophysiology [facility]

## 2020-06-30 NOTE — Progress Notes (Signed)
Patient ID: Jean Gay, female   DOB: 1991/04/23, 29 y.o.   MRN: [id number] Patient enrolled for [employer] to ship a 7 day ZIO XT long term holter monitor to her home.

## 2020-06-30 NOTE — Patient Instructions (Addendum)
Medication Instructions:  Your physician recommends that you continue on your current medications as directed. Please refer to the Current Medication list given to you today. *If you need a refill on your cardiac medications before your next appointment, please call your pharmacy*  Lab Work: None ordered. If you have labs (blood work) drawn today and your tests are completely normal, you will receive your results only by: Marland Kitchen MyChart Message (if you have MyChart) OR . A paper copy in the mail If you have any lab test that is abnormal or we need to change your treatment, we will call you to review the results.  Testing/Procedures: Your physician has recommended that you wear a holter monitor. Holter monitors are medical devices that record the heart's electrical activity. Doctors most often use these monitors to diagnose arrhythmias. Arrhythmias are problems with the speed or rhythm of the heartbeat. The monitor is a small, portable device. You can wear one while you do your normal daily activities. This is usually used to diagnose what is causing palpitations/syncope (passing out).  You will wear a 7 day zio monitor  Follow-Up: At [facility], you and your health needs are our priority.  As part of our continuing mission to provide you with exceptional heart care, we have created designated Provider Care Teams.  These Care Teams include your primary Cardiologist (physician) and Advanced Practice Providers (APPs -  Physician Assistants and Nurse Practitioners) who all work together to provide you with the care you need, when you need it.  Your next appointment:   Your physician wants you to follow-up in: based on results of your heart monitor  ZIO XT- Long Term Monitor Instructions   Your physician has requested you wear your ZIO patch monitor_7_days.   This is a single patch monitor.  [employer] supplies one patch monitor per enrollment.  Additional stickers are not available.   Please do  not apply patch if you will be having a Nuclear Stress Test, Echocardiogram, Cardiac CT, MRI, or Chest Xray during the time frame you would be wearing the monitor. The patch cannot be worn during these tests.  You cannot remove and re-apply the ZIO XT patch monitor.   Your ZIO patch monitor will be sent USPS Priority mail from [facility] directly to your home address. The monitor may also be mailed to a PO BOX if home delivery is not available.   It may take 3-5 days to receive your monitor after you have been enrolled.   Once you have received you monitor, please review enclosed instructions.  Your monitor has already been registered assigning a specific monitor serial # to you.   Applying the monitor   Shave hair from upper left chest.   Hold abrader disc by orange tab.  Rub abrader in 40 strokes over left upper chest as indicated in your monitor instructions.   Clean area with 4 enclosed alcohol pads .  Use all pads to assure are is cleaned thoroughly.  Let dry.   Apply patch as indicated in monitor instructions.  Patch will be place under collarbone on left side of chest with arrow pointing upward.   Rub patch adhesive wings for 2 minutes.Remove white label marked "1".  Remove white label marked "2".  Rub patch adhesive wings for 2 additional minutes.   While looking in a mirror, press and release button in center of patch.  A small green light will flash 3-4 times .  This will be your only indicator the  monitor has been turned on.     Do not shower for the first 24 hours.  You may shower after the first 24 hours.   Press button if you feel a symptom. You will hear a small click.  Record Date, Time and Symptom in the Patient Log Book.   When you are ready to remove patch, follow instructions on last 2 pages of Patient Log Book.  Stick patch monitor onto last page of Patient Log Book.   Place Patient Log Book in [place] box.  Use locking tab on box and tape box closed securely.   The [employer] and [employer] has [employer] on it.  Please place in mailbox as soon as possible.  Your physician should have your test results approximately 7 days after the monitor has been mailed back to [facility].   Call [facility] Customer Care at [phone] if you have questions regarding your ZIO XT patch monitor.  Call them immediately if you see an orange light blinking on your monitor.   If your monitor falls off in less than 4 days contact our Monitor department at [phone].  If your monitor becomes loose or falls off after 4 days call [employer] at [phone] for suggestions on securing your monitor.

## 2020-07-05 ENCOUNTER — Ambulatory Visit (INDEPENDENT_AMBULATORY_CARE_PROVIDER_SITE_OTHER): Payer: 59 | Admitting: Obstetrics

## 2020-07-05 ENCOUNTER — Other Ambulatory Visit (INDEPENDENT_AMBULATORY_CARE_PROVIDER_SITE_OTHER): Payer: 59

## 2020-07-05 ENCOUNTER — Other Ambulatory Visit: Payer: Self-pay

## 2020-07-05 VITALS — BP 110/70 | Wt 174.8 lb

## 2020-07-05 DIAGNOSIS — Z3A30 30 weeks gestation of pregnancy: Secondary | ICD-10-CM

## 2020-07-05 DIAGNOSIS — Z3480 Encounter for supervision of other normal pregnancy, unspecified trimester: Secondary | ICD-10-CM

## 2020-07-05 DIAGNOSIS — R000 Tachycardia, unspecified: Secondary | ICD-10-CM

## 2020-07-05 DIAGNOSIS — Z23 Encounter for immunization: Secondary | ICD-10-CM

## 2020-07-05 LAB — POCT URINALYSIS DIPSTICK OB
Glucose, UA: NEGATIVE
POC,PROTEIN,UA: NEGATIVE

## 2020-07-05 NOTE — Progress Notes (Signed)
C/o saw cardiologist; got 2nd covid shot - had fever all day Sat p covid shot; rj

## 2020-07-05 NOTE — Progress Notes (Signed)
Routine Prenatal Care Visit  Subjective  Jean Gay is a 29 y.o. G1P0 at [redacted]w[redacted]d being seen today for ongoing prenatal care.  She is currently monitored for the following issues for this low-risk pregnancy and has Supervision of other normal pregnancy, antepartum; Menorrhagia; and PMDD (premenstrual dysphoric disorder) on their problem list.  ----------------------------------------------------------------------------------- Patient reports she is wearing a holter monitor that was placed by the cardiologist.Still having episodes of tachycardia, and has had some difficulty feeling faint as well. Her husband is present and is concerned about her driving during these episodes..    .  .   . Leaking Fluid denies.  ----------------------------------------------------------------------------------- The following portions of the patient's history were reviewed and updated as appropriate: allergies, current medications, past family history, past medical history, past social history, past surgical history and problem list. Problem list updated.  Objective  Blood pressure 110/70, weight 174 lb 12.8 oz (79.3 kg), last menstrual period 12/04/2019. Pregravid weight 150 lb (68 kg) Total Weight Gain 24 lb 12.8 oz (11.2 kg) Urinalysis: Urine Protein Negative  Urine Glucose Negative  Fetal Status:           General:  Alert, oriented and cooperative. Patient is in no acute distress.  Skin: Skin is warm and dry. No rash noted.   Cardiovascular: Normal heart rate noted  Respiratory: Normal respiratory effort, no problems with respiration noted  Abdomen: Soft, gravid, appropriate for gestational age.       Pelvic:  Cervical exam deferred        Extremities: Normal range of motion.     Mental Status: Normal mood and affect. Normal behavior. Normal judgment and thought content.   Assessment   29 y.o. G1P0 at [redacted]w[redacted]d by  09/09/2020, by Last Menstrual Period presenting for routine prenatal visit  Plan    Pregnancy #1 Problems (from 01/27/20 to present)    Problem Noted Resolved   Supervision of other normal pregnancy, antepartum 01/27/2020 by Natale Milch, MD No   Overview Addendum 06/21/2020 10:13 AM by Mirna Mires, CNM     Nursing Staff Provider  Office Location  [facility] Dating  LMP. [facility] 09/09/2020  Language  English Anatomy US    Flu Vaccine   Genetic Screen  NIPS: diploid XY   AFP: negative  maternT neg, XY  TDaP vaccine    Hgb A1C or  GTT Early : Third trimester : ordered  Rhogam     LAB RESULTS   Feeding Plan  Blood Type O/Positive/-- (05/12 1431)   Contraception  Antibody Negative (05/12 1431)  Circumcision  Rubella <0.90 (05/12 1431)  Pediatrician   RPR Non Reactive (05/12 1431)   Support Person  HBsAg Negative (05/12 1431)   Prenatal Classes  HIV Non Reactive (05/12 1431)    Varicella  Immune  BTL Consent  GBS  (For PCN allergy, check sensitivities)        VBAC Consent  Pap      Hgb Electro      CF      SMA               Previous Version       Term labor symptoms and general obstetric precautions including but not limited to vaginal bleeding, contractions, leaking of fluid and fetal movement were reviewed in detail with the patient. Please refer to After Visit Summary for other counseling recommendations.  Discussed adjusting her work due to her not remembering parts of her drive home. She believes this is during  the tachycardic episodes. Has follow up with the cardiologist in a week. Advised her to address this with her employer or consider getting someone to drive her to and from work. We will work wotih her once she decides how to manage the work situation- may need some FMLA. Will review information from the cardiologist. Return in about 2 weeks (around 07/19/2020) for return OB with MD.  Mirna Mires, CNM  07/05/2020 5:41 PM

## 2020-07-21 ENCOUNTER — Encounter: Payer: Self-pay | Admitting: Obstetrics and Gynecology

## 2020-07-21 ENCOUNTER — Ambulatory Visit (INDEPENDENT_AMBULATORY_CARE_PROVIDER_SITE_OTHER): Payer: 59 | Admitting: Obstetrics and Gynecology

## 2020-07-21 ENCOUNTER — Other Ambulatory Visit: Payer: Self-pay

## 2020-07-21 VITALS — BP 108/70 | Ht 65.0 in | Wt 181.0 lb

## 2020-07-21 DIAGNOSIS — Z3480 Encounter for supervision of other normal pregnancy, unspecified trimester: Secondary | ICD-10-CM

## 2020-07-21 LAB — POCT URINALYSIS DIPSTICK OB
Glucose, UA: NEGATIVE
POC,PROTEIN,UA: NEGATIVE

## 2020-07-21 NOTE — Progress Notes (Signed)
PT decline the Flu shot today. Pt states she has been having some off and on cramping. (light )

## 2020-07-21 NOTE — Patient Instructions (Signed)
Third Trimester of Pregnancy The third trimester is from week 28 through week 40 (months 7 through 9). The third trimester is a time when the unborn baby (fetus) is growing rapidly. At the end of the ninth month, the fetus is about 20 inches in length and weighs 6-10 pounds. Body changes during your third trimester Your body will continue to go through many changes during pregnancy. The changes vary from woman to woman. During the third trimester:  Your weight will continue to increase. You can expect to gain 25-35 pounds (11-16 kg) by the end of the pregnancy.  You may begin to get stretch marks on your hips, abdomen, and breasts.  You may urinate more often because the fetus is moving lower into your pelvis and pressing on your bladder.  You may develop or continue to have heartburn. This is caused by increased hormones that slow down muscles in the digestive tract.  You may develop or continue to have constipation because increased hormones slow digestion and cause the muscles that push waste through your intestines to relax.  You may develop hemorrhoids. These are swollen veins (varicose veins) in the rectum that can itch or be painful.  You may develop swollen, bulging veins (varicose veins) in your legs.  You may have increased body aches in the pelvis, back, or thighs. This is due to weight gain and increased hormones that are relaxing your joints.  You may have changes in your hair. These can include thickening of your hair, rapid growth, and changes in texture. Some women also have hair loss during or after pregnancy, or hair that feels dry or thin. Your hair will most likely return to normal after your baby is born.  Your breasts will continue to grow and they will continue to become tender. A yellow fluid (colostrum) may leak from your breasts. This is the first milk you are producing for your baby.  Your belly button may stick out.  You may notice more swelling in your hands,  face, or ankles.  You may have increased tingling or numbness in your hands, arms, and legs. The skin on your belly may also feel numb.  You may feel short of breath because of your expanding uterus.  You may have more problems sleeping. This can be caused by the size of your belly, increased need to urinate, and an increase in your body's metabolism.  You may notice the fetus "dropping," or moving lower in your abdomen (lightening).  You may have increased vaginal discharge.  You may notice your joints feel loose and you may have pain around your pelvic bone. What to expect at prenatal visits You will have prenatal exams every 2 weeks until week 36. Then you will have weekly prenatal exams. During a routine prenatal visit:  You will be weighed to make sure you and the baby are growing normally.  Your blood pressure will be taken.  Your abdomen will be measured to track your baby's growth.  The fetal heartbeat will be listened to.  Any test results from the previous visit will be discussed.  You may have a cervical check near your due date to see if your cervix has softened or thinned (effaced).  You will be tested for Group B streptococcus. This happens between 35 and 37 weeks. Your health care provider may ask you:  What your birth plan is.  How you are feeling.  If you are feeling the baby move.  If you have had any abnormal   symptoms, such as leaking fluid, bleeding, severe headaches, or abdominal cramping.  If you are using any tobacco products, including cigarettes, chewing tobacco, and electronic cigarettes.  If you have any questions. Other tests or screenings that may be performed during your third trimester include:  Blood tests that check for low iron levels (anemia).  Fetal testing to check the health, activity level, and growth of the fetus. Testing is done if you have certain medical conditions or if there are problems during the pregnancy.  Nonstress test  (NST). This test checks the health of your baby to make sure there are no signs of problems, such as the baby not getting enough oxygen. During this test, a belt is placed around your belly. The baby is made to move, and its heart rate is monitored during movement. What is false labor? False labor is a condition in which you feel small, irregular tightenings of the muscles in the womb (contractions) that usually go away with rest, changing position, or drinking water. These are called Braxton Hicks contractions. Contractions may last for hours, days, or even weeks before true labor sets in. If contractions come at regular intervals, become more frequent, increase in intensity, or become painful, you should see your health care provider. What are the signs of labor?  Abdominal cramps.  Regular contractions that start at 10 minutes apart and become stronger and more frequent with time.  Contractions that start on the top of the uterus and spread down to the lower abdomen and back.  Increased pelvic pressure and dull back pain.  A watery or bloody mucus discharge that comes from the vagina.  Leaking of amniotic fluid. This is also known as your "water breaking." It could be a slow trickle or a gush. Let your health care provider know if it has a color or strange odor. If you have any of these signs, call your health care provider right away, even if it is before your due date. Follow these instructions at home: Medicines  Follow your health care provider's instructions regarding medicine use. Specific medicines may be either safe or unsafe to take during pregnancy.  Take a prenatal vitamin that contains at least 600 micrograms (mcg) of folic acid.  If you develop constipation, try taking a stool softener if your health care provider approves. Eating and drinking   Eat a balanced diet that includes fresh fruits and vegetables, whole grains, good sources of protein such as meat, eggs, or tofu,  and low-fat dairy. Your health care provider will help you determine the amount of weight gain that is right for you.  Avoid raw meat and uncooked cheese. These carry germs that can cause birth defects in the baby.  If you have low calcium intake from food, talk to your health care provider about whether you should take a daily calcium supplement.  Eat four or five small meals rather than three large meals a day.  Limit foods that are high in fat and processed sugars, such as fried and sweet foods.  To prevent constipation: ? Drink enough fluid to keep your urine clear or pale yellow. ? Eat foods that are high in fiber, such as fresh fruits and vegetables, whole grains, and beans. Activity  Exercise only as directed by your health care provider. Most women can continue their usual exercise routine during pregnancy. Try to exercise for 30 minutes at least 5 days a week. Stop exercising if you experience uterine contractions.  Avoid heavy lifting.  Do   not exercise in extreme heat or humidity, or at high altitudes.  Wear low-heel, comfortable shoes.  Practice good posture.  You may continue to have sex unless your health care provider tells you otherwise. Relieving pain and discomfort  Take frequent breaks and rest with your legs elevated if you have leg cramps or low back pain.  Take warm sitz baths to soothe any pain or discomfort caused by hemorrhoids. Use hemorrhoid cream if your health care provider approves.  Wear a good support bra to prevent discomfort from breast tenderness.  If you develop varicose veins: ? Wear support pantyhose or compression stockings as told by your healthcare provider. ? Elevate your feet for 15 minutes, 3-4 times a day. Prenatal care  Write down your questions. Take them to your prenatal visits.  Keep all your prenatal visits as told by your health care provider. This is important. Safety  Wear your seat belt at all times when driving.  Make  a list of emergency phone numbers, including numbers for family, friends, the hospital, and police and fire departments. General instructions  Avoid cat litter boxes and soil used by cats. These carry germs that can cause birth defects in the baby. If you have a cat, ask someone to clean the litter box for you.  Do not travel far distances unless it is absolutely necessary and only with the approval of your health care provider.  Do not use hot tubs, steam rooms, or saunas.  Do not drink alcohol.  Do not use any products that contain nicotine or tobacco, such as cigarettes and e-cigarettes. If you need help quitting, ask your health care provider.  Do not use any medicinal herbs or unprescribed drugs. These chemicals affect the formation and growth of the baby.  Do not douche or use tampons or scented sanitary pads.  Do not cross your legs for long periods of time.  To prepare for the arrival of your baby: ? Take prenatal classes to understand, practice, and ask questions about labor and delivery. ? Make a trial run to the hospital. ? Visit the hospital and tour the maternity area. ? Arrange for maternity or paternity leave through employers. ? Arrange for family and friends to take care of pets while you are in the hospital. ? Purchase a rear-facing car seat and make sure you know how to install it in your car. ? Pack your hospital bag. ? Prepare the baby's nursery. Make sure to remove all pillows and stuffed animals from the baby's crib to prevent suffocation.  Visit your dentist if you have not gone during your pregnancy. Use a soft toothbrush to brush your teeth and be gentle when you floss. Contact a health care provider if:  You are unsure if you are in labor or if your water has broken.  You become dizzy.  You have mild pelvic cramps, pelvic pressure, or nagging pain in your abdominal area.  You have lower back pain.  You have persistent nausea, vomiting, or  diarrhea.  You have an unusual or bad smelling vaginal discharge.  You have pain when you urinate. Get help right away if:  Your water breaks before 37 weeks.  You have regular contractions less than 5 minutes apart before 37 weeks.  You have a fever.  You are leaking fluid from your vagina.  You have spotting or bleeding from your vagina.  You have severe abdominal pain or cramping.  You have rapid weight loss or weight gain.  You have   shortness of breath with chest pain.  You notice sudden or extreme swelling of your face, hands, ankles, feet, or legs.  Your baby makes fewer than 10 movements in 2 hours.  You have severe headaches that do not go away when you take medicine.  You have vision changes. Summary  The third trimester is from week 28 through week 40, months 7 through 9. The third trimester is a time when the unborn baby (fetus) is growing rapidly.  During the third trimester, your discomfort may increase as you and your baby continue to gain weight. You may have abdominal, leg, and back pain, sleeping problems, and an increased need to urinate.  During the third trimester your breasts will keep growing and they will continue to become tender. A yellow fluid (colostrum) may leak from your breasts. This is the first milk you are producing for your baby.  False labor is a condition in which you feel small, irregular tightenings of the muscles in the womb (contractions) that eventually go away. These are called Braxton Hicks contractions. Contractions may last for hours, days, or even weeks before true labor sets in.  Signs of labor can include: abdominal cramps; regular contractions that start at 10 minutes apart and become stronger and more frequent with time; watery or bloody mucus discharge that comes from the vagina; increased pelvic pressure and dull back pain; and leaking of amniotic fluid. This information is not intended to replace advice given to you by your  health care provider. Make sure you discuss any questions you have with your health care provider. Document Revised: 12/25/2018 Document Reviewed: 10/09/2016 Elsevier Patient Education  2020 [employer]  

## 2020-07-21 NOTE — Progress Notes (Signed)
Routine Prenatal Care Visit  Subjective  Jean Gay is a 29 y.o. G1P0 at [redacted]w[redacted]d being seen today for ongoing prenatal care.  She is currently monitored for the following issues for this low-risk pregnancy and has Supervision of other normal pregnancy, antepartum; Menorrhagia; and PMDD (premenstrual dysphoric disorder) on their problem list.  -----------------------------------------------------------------------------------  29 y/o G1P0 at [redacted]w[redacted]d presents for routine prenatal follow up. Pt states that overall her pregnancy has been going well. She had one isolated episode of scant amount of spotting three days ago, but has not noticed further bleeding or leaking of fluids. She has also felt isolated contractions lasting for "seconds" that are not followed by subsequent contractions. She feels frequent movement from the baby.   Pt states that since the start of her pregnancy she has had episodes of tachycardia that lasts for several minutes with associated SOB and dizziness. She states that these episodes occur at random and are not exacerbated with exertion. She checks her apple watch during these episodes and her HR wil go from the 60s to the 170s while at rest. She has been evaluated by cardiology and is pending a read of her event monitor.   Pt is also concerned about episodes of memory loss. She states that she occasionally cannot remember her hour commute to work and is concerned that this could be related to her episodes of tachycardia.    Contractions: Irritability. Vag. Bleeding: None.  Movement: Present. Denies leaking of fluid.  ----------------------------------------------------------------------------------- The following portions of the patient's history were reviewed and updated as appropriate: allergies, current medications, past family history, past medical history, past social history, past surgical history and problem list. Problem list updated.   Objective  Blood  pressure 108/70, height 5\' 5"  (1.651 m), weight 181 lb (82.1 kg), last menstrual period 12/04/2019. Pregravid weight 150 lb (68 kg) Total Weight Gain 31 lb (14.1 kg) Urinalysis:      Fetal Status: Fetal Heart Rate (bpm): 150 Fundal Height: 32 cm Movement: Present     General:  Alert, oriented and cooperative. Patient is in no acute distress.  Skin: Skin is warm and dry. No rash noted.   Cardiovascular: Normal heart rate noted  Respiratory: Normal respiratory effort, no problems with respiration noted  Abdomen: Soft, gravid, appropriate for gestational age. Pain/Pressure: Absent     Pelvic:  Cervical exam deferred        Extremities: Normal range of motion.  Edema: None  Mental Status: Normal mood and affect. Normal behavior. Normal judgment and thought content.     Assessment   29 y.o. G1P0 at [redacted]w[redacted]d by  09/09/2020, by Last Menstrual Period presenting for routine prenatal visit  Plan   Pregnancy #1 Problems (from 01/27/20 to present)    Problem Noted Resolved   Supervision of other normal pregnancy, antepartum 01/27/2020 by 03/28/2020, MD No   Overview Addendum 07/21/2020  9:01 AM by 13/12/2019, MD     Nursing Staff Provider  Office Location  [facility] Dating  LMP. [facility] 09/09/2020  Language  English Anatomy 09/11/2020  Normal female  Flu Vaccine  declined Genetic Screen  NIPS: diploid XY   AFP: negative  maternT neg, XY  TDaP vaccine   10/19 Hgb A1C or  GTT Early : Third trimester : 111  Rhogam   not needed   LAB RESULTS   Feeding Plan  breast Blood Type O/Positive/-- (05/12 1431)   Contraception  Antibody Negative (05/12 1431)  Circumcision  Rubella <  0.90 (05/12 1431)  Pediatrician   RPR Non Reactive (05/12 1431)   Support Person husband HBsAg Negative (05/12 1431)   Prenatal Classes  HIV Non Reactive (05/12 1431)    Varicella  Immune  BTL Consent  GBS  (For PCN allergy, check sensitivities)        VBAC Consent  Pap  2021 NIL    Hgb Electro      CF      SMA                 Previous Version       Given note to not drive and work from home.   Gestational age appropriate obstetric precautions including but not limited to vaginal bleeding, contractions, leaking of fluid and fetal movement were reviewed in detail with the patient.    Return in about 2 weeks (around 08/04/2020) for ROB in person.  Natale Milch MD [facility], [facility] 07/21/2020, 9:01 AM

## 2020-07-21 NOTE — Progress Notes (Signed)
29 y/o G1P0 at [redacted]w[redacted]d presents for routine prenatal follow up. Pt states that overall her pregnancy has been going well. She had one isolated episode of scant amount of spotting three days ago, but has not noticed further bleeding or leaking of fluids. She has also felt isolated contractions lasting for "seconds" that are not followed by subsequent contractions. She feels frequent movement from the baby.   Pt states that since the start of her pregnancy she has had episodes of tachycardia that lasts for several minutes with associated SOB and dizziness. She states that these episodes occur at random and are not exacerbated with exertion. She checks her apple watch during these episodes and her HR wil go from the 60s to the 170s while at rest. She has been evaluated by cardiology and is pending a read of her event monitor.   Pt is also concerned about episodes of memory loss. She states that she occasionally cannot remember her hour commute to work and is concerned that this could be related to her episodes of tachycardia.

## 2020-07-22 ENCOUNTER — Telehealth: Payer: Self-pay

## 2020-07-22 NOTE — Telephone Encounter (Signed)
Pt calling; 33wks; thinks started losing mucus plug; vagina is pretty sore; cramping daily.  [phone]  True ctxs described to pt; pt states her lower abd gets tight with the cramping; adv losing mucus plug does not mean she is going into labor.  Labor precautions given.  Pt reassured.

## 2020-08-05 ENCOUNTER — Other Ambulatory Visit: Payer: Self-pay

## 2020-08-05 ENCOUNTER — Ambulatory Visit (INDEPENDENT_AMBULATORY_CARE_PROVIDER_SITE_OTHER): Payer: 59 | Admitting: Obstetrics

## 2020-08-05 VITALS — BP 104/80 | Wt 156.0 lb

## 2020-08-05 DIAGNOSIS — Z3A35 35 weeks gestation of pregnancy: Secondary | ICD-10-CM

## 2020-08-05 DIAGNOSIS — Z3480 Encounter for supervision of other normal pregnancy, unspecified trimester: Secondary | ICD-10-CM

## 2020-08-05 LAB — POCT URINALYSIS DIPSTICK OB
Glucose, UA: NEGATIVE
POC,PROTEIN,UA: NEGATIVE

## 2020-08-05 NOTE — Addendum Note (Signed)
Addended by: Loran Senters D on: 08/05/2020 05:33 PM   Modules accepted: Orders

## 2020-08-05 NOTE — Progress Notes (Signed)
Routine Prenatal Care Visit  Subjective  Jean Gay is a 29 y.o. G1P0 at [redacted]w[redacted]d being seen today for ongoing prenatal care.  She is currently monitored for the following issues for this low-risk pregnancy and has Supervision of other normal pregnancy, antepartum; Menorrhagia; and PMDD (premenstrual dysphoric disorder) on their problem list.  ----------------------------------------------------------------------------------- Patient reports no bleeding, no contractions, no leaking and and she has felt like the baby is very low, Feeling pressure and swollen "down there"..    .  .   Pincus Large Fluid denies.  ----------------------------------------------------------------------------------- The following portions of the patient's history were reviewed and updated as appropriate: allergies, current medications, past family history, past medical history, past social history, past surgical history and problem list. Problem list updated.  Objective  Blood pressure 104/80, weight 156 lb (70.8 kg), last menstrual period 12/04/2019. Pregravid weight 150 lb (68 kg) Total Weight Gain 6 lb (2.722 kg) Urinalysis: Urine Protein    Urine Glucose    Fetal Status:           General:  Alert, oriented and cooperative. Patient is in no acute distress.  Skin: Skin is warm and dry. No rash noted.   Cardiovascular: Normal heart rate noted  Respiratory: Normal respiratory effort, no problems with respiration noted  Abdomen: Soft, gravid, appropriate for gestational age.       Pelvic:  Cervical exam performed      closed, very posterior, 60%/-3  Extremities: Normal range of motion.     Mental Status: Normal mood and affect. Normal behavior. Normal judgment and thought content.   Assessment   29 y.o. G1P0 at [redacted]w[redacted]d by  09/09/2020, by Last Menstrual Period presenting for routine prenatal visit  Plan   Pregnancy #1 Problems (from 01/27/20 to present)    Problem Noted Resolved   Supervision of other normal  pregnancy, antepartum 01/27/2020 by Natale Milch, MD No   Overview Addendum 07/21/2020  9:01 AM by Natale Milch, MD     Nursing Staff Provider  Office Location  [facility] Dating  LMP. [facility] 09/09/2020  Language  English Anatomy US  Normal female  Flu Vaccine  declined Genetic Screen  NIPS: diploid XY   AFP: negative  maternT neg, XY  TDaP vaccine   10/19 Hgb A1C or  GTT Early : Third trimester : 111  Rhogam   not needed   LAB RESULTS   Feeding Plan  breast Blood Type O/Positive/-- (05/12 1431)   Contraception  Antibody Negative (05/12 1431)  Circumcision  Rubella <0.90 (05/12 1431)  Pediatrician   RPR Non Reactive (05/12 1431)   Support Person husband HBsAg Negative (05/12 1431)   Prenatal Classes  HIV Non Reactive (05/12 1431)    Varicella  Immune  BTL Consent  GBS  (For PCN allergy, check sensitivities)        VBAC Consent  Pap  2021 NIL    Hgb Electro      CF      SMA               Previous Version       Preterm labor symptoms and general obstetric precautions including but not limited to vaginal bleeding, contractions, leaking of fluid and fetal movement were reviewed in detail with the patient. Please refer to After Visit Summary for other counseling recommendations.  Rassured by today's exam. She is going to wear her maternity belt. No obvious labial swelling today.  Return in about 1 week (around 08/12/2020) for return  Willene Hatchet, CNM  08/05/2020 5:09 PM

## 2020-08-05 NOTE — Progress Notes (Signed)
C/o - ctxs ~3/hr; vulva is changing; swelling feet legs hands face; adv good supportive tennis shoes, watch salt intake, elevate feet as much as possible; explained diff between true ctxs and B-H ctxs. rj

## 2020-08-16 ENCOUNTER — Ambulatory Visit (INDEPENDENT_AMBULATORY_CARE_PROVIDER_SITE_OTHER): Payer: 59 | Admitting: Obstetrics

## 2020-08-16 ENCOUNTER — Other Ambulatory Visit: Payer: Self-pay

## 2020-08-16 VITALS — BP 112/70 | Ht 65.0 in | Wt 192.6 lb

## 2020-08-16 DIAGNOSIS — Z3480 Encounter for supervision of other normal pregnancy, unspecified trimester: Secondary | ICD-10-CM

## 2020-08-16 DIAGNOSIS — Z3A36 36 weeks gestation of pregnancy: Secondary | ICD-10-CM

## 2020-08-16 LAB — POCT URINALYSIS DIPSTICK OB
Glucose, UA: NEGATIVE
POC,PROTEIN,UA: NEGATIVE

## 2020-08-16 NOTE — Progress Notes (Signed)
Routine Prenatal Care Visit  Subjective  Jean Gay is a 28 y.o. G1P0 at [redacted]w[redacted]d being seen today for ongoing prenatal care.  She is currently monitored for the following issues for this low-risk pregnancy and has Supervision of other normal pregnancy, antepartum; Menorrhagia; and PMDD (premenstrual dysphoric disorder) on their problem list.  ----------------------------------------------------------------------------------- Patient reports nausea, no bleeding, no cramping, no leaking, occasional contractions and and she is uncomfortable. Some difficulty sleeping..   Contractions: Not present. Vag. Bleeding: None.  Movement: Present. Leaking Fluid denies.  ----------------------------------------------------------------------------------- The following portions of the patient's history were reviewed and updated as appropriate: allergies, current medications, past family history, past medical history, past social history, past surgical history and problem list. Problem list updated.  Objective  Blood pressure 112/70, height 5\' 5"  (1.651 m), weight 192 lb 9.6 oz (87.4 kg), last menstrual period 12/04/2019. Pregravid weight 150 lb (68 kg) Total Weight Gain 42 lb 9.6 oz (19.3 kg) Urinalysis: Urine Protein    Urine Glucose    Fetal Status:     Movement: Present     General:  Alert, oriented and cooperative. Patient is in no acute distress.  Skin: Skin is warm and dry. No rash noted.   Cardiovascular: Normal heart rate noted  Respiratory: Normal respiratory effort, no problems with respiration noted  Abdomen: Soft, gravid, appropriate for gestational age. Pain/Pressure: Present     Pelvic:  Cervical exam performed        Extremities: Normal range of motion.     Mental Status: Normal mood and affect. Normal behavior. Normal judgment and thought content.   Assessment   29 y.o. G1P0 at [redacted]w[redacted]d by  09/09/2020, by Last Menstrual Period presenting for routine prenatal visit  Plan   Pregnancy  #1 Problems (from 01/27/20 to present)    Problem Noted Resolved   Supervision of other normal pregnancy, antepartum 01/27/2020 by 03/28/2020, MD No   Overview Addendum 07/21/2020  9:01 AM by 13/12/2019, MD     Nursing Staff Provider  Office Location  [facility] Dating  LMP. [facility] 09/09/2020  Language  English Anatomy 09/11/2020  Normal female  Flu Vaccine  declined Genetic Screen  NIPS: diploid XY   AFP: negative  maternT neg, XY  TDaP vaccine   10/19 Hgb A1C or  GTT Early : Third trimester : 111  Rhogam   not needed   LAB RESULTS   Feeding Plan  breast Blood Type O/Positive/-- (05/12 1431)   Contraception  Antibody Negative (05/12 1431)  Circumcision  Rubella <0.90 (05/12 1431)  Pediatrician   RPR Non Reactive (05/12 1431)   Support Person husband HBsAg Negative (05/12 1431)   Prenatal Classes  HIV Non Reactive (05/12 1431)    Varicella  Immune  BTL Consent  GBS  (For PCN allergy, check sensitivities)        VBAC Consent  Pap  2021 NIL    Hgb Electro      CF      SMA               Previous Version       Term labor symptoms and general obstetric precautions including but not limited to vaginal bleeding, contractions, leaking of fluid and fetal movement were reviewed in detail with the patient. Please refer to After Visit Summary for other counseling recommendations.  GBS culture retrieved today.  Return in about 1 week (around 08/23/2020) for return OB.  14/03/2020, CNM  08/16/2020 4:57 PM

## 2020-08-16 NOTE — Progress Notes (Signed)
ROB [id number]

## 2020-08-21 LAB — CULTURE, BETA STREP (GROUP B ONLY): Strep Gp B Culture: NEGATIVE

## 2020-08-26 ENCOUNTER — Other Ambulatory Visit: Payer: Self-pay

## 2020-08-26 ENCOUNTER — Ambulatory Visit (INDEPENDENT_AMBULATORY_CARE_PROVIDER_SITE_OTHER): Payer: 59 | Admitting: Obstetrics

## 2020-08-26 VITALS — BP 120/70 | Ht 65.0 in | Wt 192.6 lb

## 2020-08-26 DIAGNOSIS — Z3A38 38 weeks gestation of pregnancy: Secondary | ICD-10-CM

## 2020-08-26 DIAGNOSIS — Z3480 Encounter for supervision of other normal pregnancy, unspecified trimester: Secondary | ICD-10-CM

## 2020-08-26 NOTE — Progress Notes (Signed)
  Routine Prenatal Care Visit  Subjective  Jean Gay is a 29 y.o. G1P0 at [redacted]w[redacted]d being seen today for ongoing prenatal care.  She is currently monitored for the following issues for this low-risk pregnancy and has Supervision of other normal pregnancy, antepartum; Menorrhagia; and PMDD (premenstrual dysphoric disorder) on their problem list.  ----------------------------------------------------------------------------------- Patient reports no complaints.   Contractions: Not present. Vag. Bleeding: None.  Movement: Present. Leaking Fluid denies.  ----------------------------------------------------------------------------------- The following portions of the patient's history were reviewed and updated as appropriate: allergies, current medications, past family history, past medical history, past social history, past surgical history and problem list. Problem list updated.  Objective  Blood pressure 120/70, height 5\' 5"  (1.651 m), weight 192 lb 9.6 oz (87.4 kg), last menstrual period 12/04/2019. Pregravid weight 150 lb (68 kg) Total Weight Gain 42 lb 9.6 oz (19.3 kg) Urinalysis: Urine Protein    Urine Glucose    Fetal Status:     Movement: Present     General:  Alert, oriented and cooperative. Patient is in no acute distress.  Skin: Skin is warm and dry. No rash noted.   Cardiovascular: Normal heart rate noted  Respiratory: Normal respiratory effort, no problems with respiration noted  Abdomen: Soft, gravid, appropriate for gestational age. Pain/Pressure: Absent     Pelvic:  Cervical exam performed      1cm/80%/-2- soft and posterior cervix  Extremities: Normal range of motion.     Mental Status: Normal mood and affect. Normal behavior. Normal judgment and thought content.   Assessment   29 y.o. G1P0 at [redacted]w[redacted]d by  09/09/2020, by Last Menstrual Period presenting for routine prenatal visit  Plan   Pregnancy #1 Problems (from 01/27/20 to present)    Problem Noted Resolved    Supervision of other normal pregnancy, antepartum 01/27/2020 by 03/28/2020, MD No   Overview Addendum 08/22/2020 12:26 PM by 14/02/2020, CNM     Nursing Staff Provider  Office Location  [facility] Dating  LMP. [facility] 09/09/2020  Language  English Anatomy 09/11/2020  Normal female  Flu Vaccine  declined Genetic Screen  NIPS: diploid XY   AFP: negative  maternT neg, XY  TDaP vaccine   10/19 Hgb A1C or  GTT Early : Third trimester : 111  Rhogam   not needed   LAB RESULTS   Feeding Plan  breast Blood Type O/Positive/-- (05/12 1431)   Contraception  Antibody Negative (05/12 1431)  Circumcision  Rubella <0.90 (05/12 1431)  Pediatrician   RPR Non Reactive (05/12 1431)   Support Person husband HBsAg Negative (05/12 1431)   Prenatal Classes  HIV Non Reactive (05/12 1431)    Varicella  Immune  BTL Consent  GBS  (For PCN allergy, check sensitivities) negative       VBAC Consent  Pap  2021 NIL    Hgb Electro      CF      SMA               Previous Version       Term labor symptoms and general obstetric precautions including but not limited to vaginal bleeding, contractions, leaking of fluid and fetal movement were reviewed in detail with the patient. Please refer to After Visit Summary for other counseling recommendations.   Return in about 1 week (around 09/02/2020) for return OB.  09/04/2020, CNM  08/26/2020 11:29 AM

## 2020-08-26 NOTE — Progress Notes (Signed)
ROB 38w 

## 2020-08-29 ENCOUNTER — Observation Stay: Payer: 59 | Admitting: Anesthesiology

## 2020-08-29 ENCOUNTER — Other Ambulatory Visit: Payer: Self-pay | Admitting: Obstetrics

## 2020-08-29 ENCOUNTER — Encounter: Payer: Self-pay | Admitting: Obstetrics

## 2020-08-29 ENCOUNTER — Inpatient Hospital Stay
Admission: EM | Admit: 2020-08-29 | Discharge: 2020-08-31 | DRG: 806 | Disposition: A | Discharge status: Home / Self Care | Payer: 59 | Attending: Obstetrics and Gynecology | Admitting: Obstetrics and Gynecology

## 2020-08-29 ENCOUNTER — Other Ambulatory Visit: Payer: Self-pay

## 2020-08-29 DIAGNOSIS — O9081 Anemia of the puerperium: Secondary | ICD-10-CM | POA: Diagnosis not present

## 2020-08-29 DIAGNOSIS — Z3A38 38 weeks gestation of pregnancy: Secondary | ICD-10-CM

## 2020-08-29 DIAGNOSIS — Z23 Encounter for immunization: Secondary | ICD-10-CM | POA: Diagnosis not present

## 2020-08-29 DIAGNOSIS — Z3480 Encounter for supervision of other normal pregnancy, unspecified trimester: Secondary | ICD-10-CM

## 2020-08-29 DIAGNOSIS — Z20822 Contact with and (suspected) exposure to covid-19: Secondary | ICD-10-CM | POA: Diagnosis present

## 2020-08-29 DIAGNOSIS — O26893 Other specified pregnancy related conditions, third trimester: Secondary | ICD-10-CM | POA: Diagnosis present

## 2020-08-29 DIAGNOSIS — D62 Acute posthemorrhagic anemia: Secondary | ICD-10-CM | POA: Diagnosis not present

## 2020-08-29 DIAGNOSIS — O4292 Full-term premature rupture of membranes, unspecified as to length of time between rupture and onset of labor: Secondary | ICD-10-CM | POA: Diagnosis not present

## 2020-08-29 LAB — URINALYSIS, COMPLETE (UACMP) WITH MICROSCOPIC
Bilirubin Urine: NEGATIVE
Glucose, UA: NEGATIVE mg/dL
Hgb urine dipstick: NEGATIVE
Ketones, ur: NEGATIVE mg/dL
Nitrite: NEGATIVE
Protein, ur: 30 mg/dL — AB
Specific Gravity, Urine: 1.013 (ref 1.005–1.030)
pH: 7 (ref 5.0–8.0)

## 2020-08-29 LAB — COMPREHENSIVE METABOLIC PANEL
ALT: 14 U/L (ref 0–44)
AST: 25 U/L (ref 15–41)
Albumin: 3.4 g/dL — ABNORMAL LOW (ref 3.5–5.0)
Alkaline Phosphatase: 125 U/L (ref 38–126)
Anion gap: 10 (ref 5–15)
BUN: 16 mg/dL (ref 6–20)
CO2: 23 mmol/L (ref 22–32)
Calcium: 9.5 mg/dL (ref 8.9–10.3)
Chloride: 102 mmol/L (ref 98–111)
Creatinine, Ser: 0.95 mg/dL (ref 0.44–1.00)
GFR, Estimated: >60 mL/min (ref >60)
Glucose, Bld: 98 mg/dL (ref 70–99)
Potassium: 4.2 mmol/L (ref 3.5–5.1)
Sodium: 135 mmol/L (ref 135–145)
Total Bilirubin: 0.5 mg/dL (ref 0.3–1.2)
Total Protein: 7.1 g/dL (ref 6.5–8.1)

## 2020-08-29 LAB — CBC
HCT: 34.6 % — ABNORMAL LOW (ref 36.0–46.0)
Hemoglobin: 12.2 g/dL (ref 12.0–15.0)
MCH: 32.2 pg (ref 26.0–34.0)
MCHC: 35.3 g/dL (ref 30.0–36.0)
MCV: 91.3 fL (ref 80.0–100.0)
Platelets: 239 10*3/uL (ref 150–400)
RBC: 3.79 MIL/uL — ABNORMAL LOW (ref 3.87–5.11)
RDW: 11.9 % (ref 11.5–15.5)
WBC: 10.4 10*3/uL (ref 4.0–10.5)
nRBC: 0 % (ref 0.0–0.2)

## 2020-08-29 LAB — RESP PANEL BY RT-PCR (FLU A&B, COVID) ARPGX2
Influenza A by PCR: NEGATIVE
Influenza B by PCR: NEGATIVE
SARS Coronavirus 2 by RT PCR: NEGATIVE

## 2020-08-29 LAB — TYPE AND SCREEN
ABO/RH(D): O POS
Antibody Screen: NEGATIVE

## 2020-08-29 LAB — PROTEIN / CREATININE RATIO, URINE
Creatinine, Urine: 74 mg/dL
Protein Creatinine Ratio: 0.43 mg/mg{Cre} — ABNORMAL HIGH (ref 0.00–0.15)
Total Protein, Urine: 32 mg/dL

## 2020-08-29 MED ORDER — BUPIVACAINE HCL (PF) 0.25 % IJ SOLN
INTRAMUSCULAR | Status: DC | PRN
  Administered 2020-08-29: 7 mL via EPIDURAL

## 2020-08-29 MED ORDER — PHENYLEPHRINE 40 MCG/ML (10ML) SYRINGE FOR IV PUSH (FOR BLOOD PRESSURE SUPPORT)
80.0000 ug | PREFILLED_SYRINGE | INTRAVENOUS | Status: DC | PRN
  Filled 2020-08-29: qty 10

## 2020-08-29 MED ORDER — OXYTOCIN-SODIUM CHLORIDE 30-0.9 UT/500ML-% IV SOLN
1.0000 m[IU]/min | INTRAVENOUS | Status: DC
  Administered 2020-08-29: 23:00:00 2 m[IU]/min via INTRAVENOUS

## 2020-08-29 MED ORDER — OXYTOCIN BOLUS FROM INFUSION
333.0000 mL | Freq: Once | INTRAVENOUS | Status: AC
  Administered 2020-08-30: 13:00:00 333 mL via INTRAVENOUS

## 2020-08-29 MED ORDER — OXYCODONE-ACETAMINOPHEN 5-325 MG PO TABS
1.0000 | ORAL_TABLET | ORAL | Status: DC | PRN
Start: 2020-08-29 — End: 2020-08-30

## 2020-08-29 MED ORDER — LACTATED RINGERS IV SOLN: 500.0000 mL | Freq: Once | INTRAVENOUS | Status: DC

## 2020-08-29 MED ORDER — BUTORPHANOL TARTRATE 1 MG/ML IJ SOLN
1.0000 mg | INTRAMUSCULAR | Status: DC | PRN
  Administered 2020-08-29: 1 mg via INTRAVENOUS
  Filled 2020-08-29: qty 1

## 2020-08-29 MED ORDER — BUTORPHANOL TARTRATE 1 MG/ML IJ SOLN
INTRAMUSCULAR | Status: AC
  Filled 2020-08-29: qty 1

## 2020-08-29 MED ORDER — FENTANYL 2.5 MCG/ML W/ROPIVACAINE 0.15% IN NS 100 ML EPIDURAL (ARMC)
EPIDURAL | Status: AC
  Filled 2020-08-29: qty 100

## 2020-08-29 MED ORDER — FENTANYL 2.5 MCG/ML W/ROPIVACAINE 0.15% IN NS 100 ML EPIDURAL (ARMC)
12.0000 mL/h | EPIDURAL | Status: DC
  Administered 2020-08-29 – 2020-08-30 (×2): 12 mL/h via EPIDURAL
  Filled 2020-08-29: qty 100

## 2020-08-29 MED ORDER — LIDOCAINE-EPINEPHRINE (PF) 1.5 %-1:200000 IJ SOLN
INTRAMUSCULAR | Status: DC | PRN
  Administered 2020-08-29: 3 mL via PERINEURAL

## 2020-08-29 MED ORDER — LIDOCAINE HCL (PF) 1 % IJ SOLN: INTRAMUSCULAR | Status: DC | PRN

## 2020-08-29 MED ORDER — TERBUTALINE SULFATE 1 MG/ML IJ SOLN: 0.2500 mg | Freq: Once | INTRAMUSCULAR | Status: DC | PRN

## 2020-08-29 MED ORDER — LACTATED RINGERS IV SOLN
INTRAVENOUS | Status: DC
  Administered 2020-08-30: 04:00:00 950 mL via INTRAVENOUS

## 2020-08-29 MED ORDER — OXYTOCIN-SODIUM CHLORIDE 30-0.9 UT/500ML-% IV SOLN
2.5000 [IU]/h | INTRAVENOUS | Status: DC
  Administered 2020-08-30: 13:00:00 2.5 [IU]/h via INTRAVENOUS
  Filled 2020-08-29 (×2): qty 500

## 2020-08-29 MED ORDER — LIDOCAINE HCL (PF) 1 % IJ SOLN
30.0000 mL | INTRAMUSCULAR | Status: AC | PRN
  Administered 2020-08-30: 30 mL via SUBCUTANEOUS
  Filled 2020-08-29: qty 30

## 2020-08-29 MED ORDER — SOD CITRATE-CITRIC ACID 500-334 MG/5ML PO SOLN: 30.0000 mL | ORAL | Status: DC | PRN

## 2020-08-29 MED ORDER — LIDOCAINE HCL (PF) 1 % IJ SOLN
INTRAMUSCULAR | Status: DC | PRN
  Administered 2020-08-29: 3 mL

## 2020-08-29 MED ORDER — MISOPROSTOL 200 MCG PO TABS
ORAL_TABLET | ORAL | Status: AC
  Administered 2020-08-30: 13:00:00 800 ug via RECTAL
  Filled 2020-08-29: qty 4

## 2020-08-29 MED ORDER — OXYCODONE-ACETAMINOPHEN 5-325 MG PO TABS: 2.0000 | ORAL_TABLET | ORAL | Status: DC | PRN

## 2020-08-29 MED ORDER — EPHEDRINE 5 MG/ML INJ
10.0000 mg | INTRAVENOUS | Status: DC | PRN
  Filled 2020-08-29: qty 2

## 2020-08-29 MED ORDER — ACETAMINOPHEN 325 MG PO TABS
650.0000 mg | ORAL_TABLET | ORAL | Status: DC | PRN
  Administered 2020-08-30 (×2): 650 mg via ORAL
  Filled 2020-08-29 (×2): qty 2

## 2020-08-29 MED ORDER — LACTATED RINGERS IV SOLN
500.0000 mL | INTRAVENOUS | Status: DC | PRN
Start: 2020-08-29 — End: 2020-08-30
  Administered 2020-08-29: 20:00:00 500 mL via INTRAVENOUS
  Administered 2020-08-30: 06:00:00 250 mL via INTRAVENOUS

## 2020-08-29 MED ORDER — DIPHENHYDRAMINE HCL 50 MG/ML IJ SOLN: 12.5000 mg | INTRAMUSCULAR | Status: DC | PRN

## 2020-08-29 MED ORDER — ONDANSETRON HCL 4 MG/2ML IJ SOLN: 4.0000 mg | Freq: Four times a day (QID) | INTRAMUSCULAR | Status: DC | PRN

## 2020-08-29 NOTE — Anesthesia Procedure Notes (Signed)
Epidural Patient location during procedure: OB Start time: 08/29/2020 9:10 PM End time: 08/29/2020 9:29 PM  Staffing Anesthesiologist: Yves Dill, MD Performed: anesthesiologist   Preanesthetic Checklist Completed: patient identified, IV checked, site marked, risks and benefits discussed, surgical consent, monitors and equipment checked, pre-op evaluation and timeout performed  Epidural Patient position: sitting Prep: Betadine Patient monitoring: heart rate, continuous pulse ox and blood pressure Approach: midline Location: L3-L4 Injection technique: LOR air  Needle:  Needle type: Tuohy  Needle gauge: 17 G Needle length: 9 cm and 9 Catheter type: closed end flexible Catheter size: 19 Gauge Test dose: negative and 1.5% lidocaine with Epi 1:200 K  Assessment Events: blood not aspirated, injection not painful, no injection resistance, no paresthesia and negative IV test  Additional Notes Time out called.  Patient placed in sitting position.  Back prepped and draped in sterile fashion.  A skin whealmwas made in the L3-L4 interspace with 1% Lidocaine plain.  A 17G Tuohy needle was advanced into the epidural space by a loss of resistance technique.  No blood, fluid or paresthesias.  The catheter was threaded 3 cm and the TD was negative.  The patient tolerated the procedure well and the catheter was affixed to the back in sterile fashion.Reason for block:procedure for pain

## 2020-08-29 NOTE — Anesthesia Preprocedure Evaluation (Signed)
Anesthesia Evaluation  Patient identified by MRN, date of birth, ID band Patient awake    Reviewed: Allergy & Precautions, NPO status , Patient's Chart, lab work & pertinent test results  Airway Mallampati: II  TM Distance: >3 FB     Dental   Pulmonary neg pulmonary ROS,    Pulmonary exam normal        Cardiovascular hypertension, Normal cardiovascular exam     Neuro/Psych PSYCHIATRIC DISORDERS Depression negative neurological ROS     GI/Hepatic negative GI ROS, Neg liver ROS,   Endo/Other  negative endocrine ROS  Renal/GU negative Renal ROS  negative genitourinary   Musculoskeletal negative musculoskeletal ROS (+)   Abdominal Normal abdominal exam  (+)   Peds negative pediatric ROS (+)  Hematology   Anesthesia Other Findings   Reproductive/Obstetrics (+) Pregnancy                             Anesthesia Physical Anesthesia Plan  ASA: II  Anesthesia Plan: Epidural   Post-op Pain Management:    Induction:   PONV Risk Score and Plan:   Airway Management Planned: Natural Airway  Additional Equipment:   Intra-op Plan:   Post-operative Plan:   Informed Consent: I have reviewed the patients History and Physical, chart, labs and discussed the procedure including the risks, benefits and alternatives for the proposed anesthesia with the patient or authorized representative who has indicated his/her understanding and acceptance.     Dental advisory given  Plan Discussed with: CRNA and Surgeon  Anesthesia Plan Comments:         Anesthesia Quick Evaluation

## 2020-08-29 NOTE — Progress Notes (Signed)
Jean Gay is a 29 y.o. G1P0 at [redacted]w[redacted]d by ultrasound admitted for rupture of membranes  Subjective: Very uncomfortable.  Has been given IV Stadol; now requesting epidural anesthesia.   Objective: BP 131/84 (BP Location: Left Arm)   Pulse 88   Temp 99.1 F (37.3 C) (Oral)   Resp 16   Ht 5\' 5"  (1.651 m)   Wt 87.1 kg   LMP 12/04/2019 (Exact Date)   BMI 31.95 kg/m  No intake/output data recorded. No intake/output data recorded.  FHT:  FHR: 110 baseline bpm, variability: moderate,  accelerations:  Present,  decelerations:  Absent UC:   regular, every 2-3 minutes SVE:   Dilation: 4 Effacement (%): 100 Station: 0 Exam by:: [email or web address], CNM  Labs: Lab Results  Component Value Date   WBC 10.4 08/29/2020   HGB 12.2 08/29/2020   HCT 34.6 (L) 08/29/2020   MCV 91.3 08/29/2020   PLT 239 08/29/2020    Assessment / Plan: Spontaneous labor, progressing normally  Low FHT baseline. Based on UPC ratio, pre eclmapsia as added diagnosis Labor: Progressing normally Preeclampsia:  will conisder magnesium for consistant elevated BPS Fetal Wellbeing:  Category II Pain Control:  Labor support without medications and and now desires epidural I/D:  n/a Anticipated MOD:  NSVD  Provided bedside labor support by the Nurse Midwife. Anesthesia contacted for epidural. Will recheck once comfortable with epidural.magnesium sulphate to start for any severe range BPS. Dr. 08/31/2020 updated on maternal/fetal status. Continuous EFM now and IV of LR.   Tiburcio Pea 08/29/2020, 9:37 PM

## 2020-08-29 NOTE — Progress Notes (Signed)
Jean Gay is a 29 y.o. G1P0 at [redacted]w[redacted]d by ultrasound admitted for rupture of membranes, and early labor.  Subjective:  After several hours using the labor ball and walking, changing positions, she is starting to frown and grimace with contractions. She continues to leak clear fluid. She describes her contractions as more painful now. Intersted in getting in the shower.  She denies any headaches or visual disturbances.   Objective: BP (!) 129/94   Pulse 93   Temp 98.4 F (36.9 C) (Oral)   Resp 18   Ht 5\' 5"  (1.651 m)   Wt 87.1 kg   LMP 12/04/2019 (Exact Date)   BMI 31.95 kg/m  No intake/output data recorded. No intake/output data recorded.  FHT:  FHR: 115 baseline bpm, using intermittent auscultation of FHTs.  FHTs through a contraction are 112-116 with no decel heard before, during, or after contraction.    UC:   regular, every 2-3 minutes SVE:   Dilation: 3 Effacement (%): 100 Station: -1,0 Exam by:: M.Danny Yackley,CNM  Labs: Lab Results  Component Value Date   WBC 10.4 08/29/2020   HGB 12.2 08/29/2020   HCT 34.6 (L) 08/29/2020   MCV 91.3 08/29/2020   PLT 239 08/29/2020    Assessment / Plan: Spontaneous labor, progressing normally  Labor: Progressing normally  Fetal Wellbeing:  reassuring FHTs per [place] Pain Control:  Labor support without medications I/D:  n/a Anticipated MOD:  NSVD   08/31/2020, CNM  08/29/2020 7:00 PM    08/31/2020 08/29/2020, 6:55 PM

## 2020-08-29 NOTE — H&P (Signed)
Jean Gay is a 29 y.o. female presenting for evaluation of possible SROM at 38 weeks 3 days gestation. She reports feeling a gush of fluid while at home around 1430, with an ongoing leaing of clear fluid thereafter. She denies recent intercourse. She is wearing a pad upon her arrival. She also reports mild cramping that started shortly after the gush of fluid.  Her baby has been moving well today. She denies any vaginal bleeding.  Her GBS is negative. OB History    Gravida  1   Para      Term      Preterm      AB      Living        SAB      IAB      Ectopic      Multiple      Live Births             Past Medical History:  Diagnosis Date   Venomous snake bite 03/11/2017   No past surgical history on file. Family History: family history includes Diabetes in her maternal grandfather; Heart disease in her maternal grandfather; Hypertension in her maternal grandfather. Social History:  reports that she has never smoked. She has never used smokeless tobacco. She reports previous alcohol use. She reports that she does not use drugs.     Maternal Diabetes: No Genetic Screening: Normal Maternal Ultrasounds/Referrals: Normal Fetal Ultrasounds or other Referrals:  None Maternal Substance Abuse:  No Significant Maternal Medications:  None Significant Maternal Lab Results:  Group B Strep negative Other Comments:  None  Review of Systems  Constitutional: Negative.   HENT: Negative.   Eyes: Negative.   Respiratory: Negative.   Cardiovascular: Negative.   Gastrointestinal: Negative.   Endocrine: Negative.   Genitourinary: Positive for vaginal discharge.       Gravid uterus. Copious amounts of clear fluid noted on the chux under patient. . Cervical exam deferred.  Musculoskeletal: Negative.   Skin: Negative.   Allergic/Immunologic: Negative.   Neurological: Negative.   Hematological: Negative.   Psychiatric/Behavioral: Negative.    History   Blood pressure (!)  129/94, pulse 93, last menstrual period 12/04/2019. Maternal Exam:  Uterine Assessment: Contraction strength is mild.  Contraction duration is 1 minute. Contraction frequency is irregular.   Abdomen: Patient reports no abdominal tenderness. Fetal presentation: vertex  Introitus: Normal vulva. Vagina is positive for vaginal discharge.  Ferning test: not done.  Nitrazine test: not done. Amniotic fluid character: clear.  Pelvis: adequate for delivery.   Cervix: not evaluated.   Physical Exam Constitutional:      Appearance: Normal appearance. She is normal weight.  HENT:     Head: Normocephalic and atraumatic.     Nose: Nose normal.     Mouth/Throat:     Comments: Wearing a mask Eyes:     Extraocular Movements: Extraocular movements intact.     Pupils: Pupils are equal, round, and reactive to light.  Cardiovascular:     Rate and Rhythm: Normal rate and regular rhythm.     Pulses: Normal pulses.     Heart sounds: Normal heart sounds.  Pulmonary:     Effort: Pulmonary effort is normal.     Breath sounds: Normal breath sounds.  Abdominal:     General: Bowel sounds are normal.     Comments: Gravid abdomen. Contractions palpate mild.  Genitourinary:    General: Normal vulva.     Vagina: Vaginal discharge present.  Comments: Copious clear fluid noted . Musculoskeletal:        General: Normal range of motion.     Cervical back: Normal range of motion and neck supple.  Skin:    General: Skin is warm and dry.  Neurological:     General: No focal deficit present.     Mental Status: She is alert and oriented to person, place, and time.  Psychiatric:        Mood and Affect: Mood normal.        Behavior: Behavior normal.     Prenatal labs: ABO, Rh: O/Positive/-- (05/12 1431) Antibody: Negative (05/12 1431) Rubella: <0.90 (05/12 1431) RPR: Non Reactive (10/04 0933)  HBsAg: Negative (05/12 1431)  HIV: Non Reactive (10/04 0933)  GBS: Negative/-- (11/30 1640)    Assessment/Plan: Spontaneous rupture of membranes at 38 weeks 3 days gestation of pregnancy. Category 1` FHTs Irregular contractions GBS negative  PLAN: Admit to Labor and Delivery IV access Intermittant ausculation of FHTS until any initiation of IV medication, or epidural. Ambulation encouraged. PO hydration, regular diet Will check cervix once more active or PRN pain, request for meidaction or in 4 hours. Will consider pitocin augmentation should labor not progress. Support patient's birth desires.  Mirna Mires, CNM  08/29/2020 4:13 PM    Mirna Mires 08/29/2020, 4:03 PM

## 2020-08-30 ENCOUNTER — Encounter: Payer: Self-pay | Admitting: Obstetrics and Gynecology

## 2020-08-30 DIAGNOSIS — Z3A38 38 weeks gestation of pregnancy: Secondary | ICD-10-CM

## 2020-08-30 DIAGNOSIS — O4292 Full-term premature rupture of membranes, unspecified as to length of time between rupture and onset of labor: Secondary | ICD-10-CM

## 2020-08-30 LAB — RPR: RPR Ser Ql: NONREACTIVE

## 2020-08-30 MED ORDER — SENNOSIDES-DOCUSATE SODIUM 8.6-50 MG PO TABS
2.0000 | ORAL_TABLET | ORAL | Status: DC
  Administered 2020-08-30: 17:00:00 2 via ORAL
  Filled 2020-08-30: qty 2

## 2020-08-30 MED ORDER — ONDANSETRON HCL 4 MG/2ML IJ SOLN: 4.0000 mg | INTRAMUSCULAR | Status: DC | PRN

## 2020-08-30 MED ORDER — BENZOCAINE-MENTHOL 20-0.5 % EX AERO
1.0000 "application " | INHALATION_SPRAY | CUTANEOUS | Status: DC | PRN
  Administered 2020-08-30: 1 via TOPICAL
  Filled 2020-08-30: qty 56

## 2020-08-30 MED ORDER — ONDANSETRON HCL 4 MG PO TABS: 4.0000 mg | ORAL_TABLET | ORAL | Status: DC | PRN

## 2020-08-30 MED ORDER — SIMETHICONE 80 MG PO CHEW: 80.0000 mg | CHEWABLE_TABLET | ORAL | Status: DC | PRN

## 2020-08-30 MED ORDER — DIPHENHYDRAMINE HCL 50 MG/ML IJ SOLN
50.0000 mg | Freq: Once | INTRAMUSCULAR | Status: AC
  Administered 2020-08-30: 05:00:00 50 mg via INTRAVENOUS
  Filled 2020-08-30: qty 1

## 2020-08-30 MED ORDER — MISOPROSTOL 200 MCG PO TABS: 800.0000 ug | ORAL_TABLET | Freq: Once | ORAL | Status: AC

## 2020-08-30 MED ORDER — DIBUCAINE (PERIANAL) 1 % EX OINT: 1.0000 "application " | TOPICAL_OINTMENT | CUTANEOUS | Status: DC | PRN

## 2020-08-30 MED ORDER — FERROUS SULFATE 325 (65 FE) MG PO TABS
325.0000 mg | ORAL_TABLET | Freq: Two times a day (BID) | ORAL | Status: DC
  Administered 2020-08-30 – 2020-08-31 (×2): 325 mg via ORAL
  Filled 2020-08-30 (×2): qty 1

## 2020-08-30 MED ORDER — IBUPROFEN 600 MG PO TABS
600.0000 mg | ORAL_TABLET | Freq: Four times a day (QID) | ORAL | Status: DC
  Administered 2020-08-30 – 2020-08-31 (×4): 600 mg via ORAL
  Filled 2020-08-30 (×5): qty 1

## 2020-08-30 MED ORDER — DIPHENHYDRAMINE HCL 25 MG PO CAPS: 25.0000 mg | ORAL_CAPSULE | Freq: Four times a day (QID) | ORAL | Status: DC | PRN

## 2020-08-30 MED ORDER — ACETAMINOPHEN 325 MG PO TABS: 650.0000 mg | ORAL_TABLET | ORAL | Status: DC | PRN

## 2020-08-30 MED ORDER — GENTAMICIN SULFATE 40 MG/ML IJ SOLN
5.0000 mg/kg | INTRAVENOUS | Status: DC
  Administered 2020-08-30 – 2020-08-31 (×2): 440 mg via INTRAVENOUS
  Filled 2020-08-30 (×2): qty 11

## 2020-08-30 MED ORDER — PRENATAL MULTIVITAMIN CH
1.0000 | ORAL_TABLET | Freq: Every day | ORAL | Status: DC
  Administered 2020-08-31: 12:00:00 1 via ORAL
  Filled 2020-08-30: qty 1

## 2020-08-30 MED ORDER — COCONUT OIL OIL: 1.0000 "application " | TOPICAL_OIL | Status: DC | PRN

## 2020-08-30 MED ORDER — SODIUM CHLORIDE 0.9 % IV SOLN
2.0000 g | Freq: Four times a day (QID) | INTRAVENOUS | Status: DC
  Administered 2020-08-30 (×2): 2 g via INTRAVENOUS
  Filled 2020-08-30 (×6): qty 2000

## 2020-08-30 MED ORDER — WITCH HAZEL-GLYCERIN EX PADS
1.0000 "application " | MEDICATED_PAD | CUTANEOUS | Status: DC | PRN
  Administered 2020-08-30: 1 via TOPICAL
  Filled 2020-08-30: qty 100

## 2020-08-30 MED ORDER — MEASLES, MUMPS & RUBELLA VAC IJ SOLR
0.5000 mL | Freq: Once | INTRAMUSCULAR | Status: AC
  Administered 2020-08-31: 15:00:00 0.5 mL via SUBCUTANEOUS
  Filled 2020-08-30 (×2): qty 0.5

## 2020-08-30 NOTE — Progress Notes (Signed)
Jean Gay is a 29 y.o. G1P0 at [redacted]w[redacted]d by ultrasound admitted for rupture of membranes. She has been laboring comfortably with epidural, and her labor augmented with pitocin.  Subjective: Ove r the last hour has had stronger rectal pressure, and is c/o urge to bear down. Per the RN report, she has had a perisitant anterior lip.  Jean Gay has slept during the night and th e epidural has worked well for her.  Now with strong urge to push.   Objective: BP 127/85   Pulse (!) 109   Temp (!) 100.4 F (38 C) (Oral)   Resp 14   Ht 5\' 5"  (1.651 m)   Wt 87.1 kg   LMP 12/04/2019 (Exact Date)   SpO2 98%   BMI 31.95 kg/m  No intake/output data recorded. No intake/output data recorded.  FHT:  FHR: 140 baseline bpm, variability: moderate,  accelerations:  Present,  decelerations:  Absent UC:   regular, every 2-3 minutes. toco adjusted to better view contraction pattern. SVE:   Dilation: 9 Effacement (%): 100 Station: Plus 1 Exam by:: [email or web address], RN  Labs: Lab Results  Component Value Date   WBC 10.4 08/29/2020   HGB 12.2 08/29/2020   HCT 34.6 (L) 08/29/2020   MCV 91.3 08/29/2020   PLT 239 08/29/2020    Assessment / Plan: Augmentation of labor, progressing well  New maternal fever of 100.4. Will start on Ampicillin and Gentamycin IV.  Labor: Augmentation with pitocin.  Preeclampsia:  No extreme hypertension, dx by UPC largely. Fetal Wellbeing:  Category I Pain Control:  Epidural and and also having "breakthrough" pressure discomfort  Will bolus epidural, and start antibiotics. Anticipate starting to push in earnest within the next hour.  Anticipated MOD:  NSVD  08/31/2020 08/30/2020, 6:29 AM

## 2020-08-30 NOTE — Progress Notes (Signed)
Jean Kinlaw is stable after SVD at 1246. Pt's support person is at bedside. Pt bonding with infant and performing skin to skin after delivery. Epidural catheter removed by RN, tip intact, no bleeding noted at site. Pt is stable and ambulatory. Pt ambulated to the bathroom and was not able to void. Pt sat on the toilet for about 10 minutes and took a warm shower for 30 minutes. Pt states she does not feel like she needs to urinate. Bladder scan performed by RN and showed 253 mL of urine, S.Jackson,MD aware and will give pt 6 hours after taking urinary catheter out. Pt ordered food and will PO hydrate. Pt transferred to mother/baby unit, RM . Report given to [facility]

## 2020-08-30 NOTE — Discharge Summary (Signed)
Postpartum Discharge Summary  Patient Name: Jean Gay DOB: May 26, 1991 MRN: [id number]  Date of admission: 08/29/2020 Delivery date:08/30/2020  Delivering provider: Prentice Docker D  Date of discharge: 08/31/2020  Admitting diagnosis: [redacted] weeks gestation of pregnancy [Z3A.38] Indication for care or intervention related to labor and delivery [O75.9] Intrauterine pregnancy: [redacted]w[redacted]d    Secondary diagnosis:  Principal Problem:   Supervision of other normal pregnancy, antepartum Active Problems:   [redacted] weeks gestation of pregnancy   Labor without complication   Encounter for care or examination of lactating mother   Postpartum care following vaginal delivery  Additional problems: none    Discharge diagnosis: Term Pregnancy Delivered                                              Post partum procedures: none Augmentation: Pitocin Complications: None  Hospital course: Onset of Labor With Vaginal Delivery      29y.o. yo G1P0 at 339w4das admitted in Active Labor on 08/29/2020. Patient had an uncomplicated labor course as follows:  Membrane Rupture Time/Date:  ,08/29/2020   Delivery Method:Vaginal, Spontaneous  Episiotomy: None  Lacerations:  2nd degree;Perineal  Patient had an uncomplicated postpartum course.  She is tolerating regular diet. Her pain is well controlled with PO medication. She is ambulating and voiding without difficulty. She is having good success using nipple shield while breastfeeding.    Patient is discharged home in stable condition on 08/31/20.  Newborn Data: Birth date:08/30/2020  Birth time:12:46 PM  Gender:Female  Living status:Living  Apgars:9 ,9  Weight:3290 g   Magnesium Sulfate received: No BMZ received: No Rhophylac:No MMR:Yes T-DaP:Given prenatally Flu: No Transfusion:No  Physical exam  Vitals:   08/30/20 2315 08/31/20 0300 08/31/20 0737 08/31/20 1114  BP: (!) 128/92 110/84 112/80   Pulse: 88 79 76   Resp: 16 18 20    Temp: 98 F (36.7  C) 97.7 F (36.5 C) 97.6 F (36.4 C) 98.4 F (36.9 C)  TempSrc: Oral Oral Oral Axillary  SpO2: 97% 99% 98%   Weight:      Height:       General: alert, cooperative and no distress Lochia: appropriate Uterine Fundus: firm Incision: N/A DVT Evaluation: No evidence of DVT seen on physical exam. Labs: Lab Results  Component Value Date   WBC 13.8 (H) 08/31/2020   HGB 9.7 (L) 08/31/2020   HCT 27.3 (L) 08/31/2020   MCV 91.6 08/31/2020   PLT 173 08/31/2020   CMP Latest Ref Rng & Units 08/29/2020  Glucose 70 - 99 mg/dL 98  BUN 6 - 20 mg/dL 16  Creatinine 0.44 - 1.00 mg/dL 0.95  Sodium 135 - 145 mmol/L 135  Potassium 3.5 - 5.1 mmol/L 4.2  Chloride 98 - 111 mmol/L 102  CO2 22 - 32 mmol/L 23  Calcium 8.9 - 10.3 mg/dL 9.5  Total Protein 6.5 - 8.1 g/dL 7.1  Total Bilirubin 0.3 - 1.2 mg/dL 0.5  Alkaline Phos 38 - 126 U/L 125  AST 15 - 41 U/L 25  ALT 0 - 44 U/L 14   Edinburgh Score: Edinburgh Postnatal Depression Scale Screening Tool 08/30/2020  I have been able to laugh and see the funny side of things. 0  I have looked forward with enjoyment to things. 0  I have blamed myself unnecessarily when things went wrong. 2  I have been anxious or worried  for no good reason. 2  I have felt scared or panicky for no good reason. 0  Things have been getting on top of me. 1  I have been so unhappy that I have had difficulty sleeping. 0  I have felt sad or miserable. 1  I have been so unhappy that I have been crying. 0  The thought of harming myself has occurred to me. 0  Edinburgh Postnatal Depression Scale Total 6      After visit meds:  Allergies as of 08/31/2020      Reactions   Oseltamivir    Dizzy Dizzy      Medication List    TAKE these medications   multivitamin-prenatal 27-0.8 MG Tabs tablet Take 1 tablet by mouth daily at 12 noon.        Discharge home in stable condition Infant Feeding: Breast Infant Disposition:home with mother Discharge instruction: per  After Visit Summary and Postpartum booklet. Activity: Advance as tolerated. Pelvic rest for 6 weeks.  Diet: routine diet Anticipated Birth Control: IUD Mirena Postpartum Appointment:6 weeks  Future Appointments: Future Appointments  Date Time Provider [place]  09/02/2020  4:10 PM Imagene Riches, CNM [facility] None   Follow up Visit:  Follow-up Information    Will Bonnet, MD. Schedule an appointment as soon as possible for a visit in 6 week(s).   Specialty: Obstetrics and Gynecology Why: Six week postpartum visit Contact information: [address] [place] [address] [phone]               SIGNED:  Christean Leaf, CNM [facility] 08/31/2020, 2:54 PM

## 2020-08-30 NOTE — Lactation Note (Signed)
This note was copied from a baby's chart. Lactation Consultation Note  Patient Name: Jean Gay XIPJA'S Date: 08/30/2020 Reason for consult: Initial assessment;1st time breastfeeding;Early term 37-38.6wks  Lactation in to assist with first feeding in LDR1. Mom is G1P1, SVD <2hrs ago. Transition called LC in to assist with feeding after several attempts.  LC attempted latching, LC notes med/large size breast in which mom reports changes during pregnancy, first tissue, with short shaft nipples that appear flat. Baby can stimulate nipples somewhat, but LC thinks that mom/baby would benefit from use of nipple shield. Multiple positions tried, baby tolerated and accepted nipple shield well in football hold on R breast. Baby continued to be on/off throughout feeding, but showed periods of strong rhythmic sucking pattern, possible a swallow or 2, nothing consistent. LC provided reassurance that baby's behavior is normal, and provided basic breastfeeding education. LC reviewed normal newborn behaviors, feeding patterns, and stomach size. Early hunger cues reviewed, benefits of ongoing skin to skin, and output expectations. Encouraged a minimum of 8 feeding attempts in first 24 hours and taught hand expression and encouraged hand expression for latching and between feeds for additional stimulation.  Encouraged to put baby to breast with all cues, and to hand express post feeds due to use of nipple shield. Discussed normal course of lactation, milk supply and demand, and changes in behavior as baby becomes more hungry.  Encouraged to call out with questions/concerns and for ongoing BF assistance.  Maternal Data Formula Feeding for Exclusion: No Has patient been taught Hand Expression?: Yes Does the patient have breastfeeding experience prior to this delivery?: No  Feeding Feeding Type: Breast Fed  LATCH Score Latch: Repeated attempts needed to sustain latch, nipple held in mouth throughout  feeding, stimulation needed to elicit sucking reflex.  Audible Swallowing: None  Type of Nipple: Flat  Comfort (Breast/Nipple): Soft / non-tender  Hold (Positioning): Assistance needed to correctly position infant at breast and maintain latch.  LATCH Score: 5  Interventions Interventions: Breast feeding basics reviewed;Assisted with latch;Skin to skin;Breast massage;Hand express;Breast compression;Adjust position;Support pillows;Position options  Lactation Tools Discussed/Used Tools: Nipple Shields Nipple shield size: 20   Consult Status Consult Status: Follow-up Date: 08/30/20 Follow-up type: In-patient    Danford Bad 08/30/2020, 2:27 PM

## 2020-08-31 LAB — CBC
HCT: 27.3 % — ABNORMAL LOW (ref 36.0–46.0)
Hemoglobin: 9.7 g/dL — ABNORMAL LOW (ref 12.0–15.0)
MCH: 32.6 pg (ref 26.0–34.0)
MCHC: 35.5 g/dL (ref 30.0–36.0)
MCV: 91.6 fL (ref 80.0–100.0)
Platelets: 173 10*3/uL (ref 150–400)
RBC: 2.98 MIL/uL — ABNORMAL LOW (ref 3.87–5.11)
RDW: 12.2 % (ref 11.5–15.5)
WBC: 13.8 10*3/uL — ABNORMAL HIGH (ref 4.0–10.5)
nRBC: 0 % (ref 0.0–0.2)

## 2020-08-31 MED ORDER — SODIUM CHLORIDE 0.9 % IV SOLN: INTRAVENOUS | Status: DC | PRN

## 2020-08-31 NOTE — Lactation Note (Signed)
This note was copied from a baby's chart. Lactation Consultation Note  Patient Name: Jean Gay BWIOM'B Date: 08/31/2020 Reason for consult: Follow-up assessment;1st time breastfeeding;Early term 37-38.6wks;Other (Comment) (nipple shield)  Lactation follow-up. Baby fed well overnight with RN noting colostrum in shield after feedings. Void and stool output exceed expectations for HOL. Mom provided reassurance with baby's output, her cramping while feeding, and baby sleeping post feeds as signs that baby is getting enough. LC reviewed milk supply and demand, normal course of lactation, putting baby to breast on demand, growth spurts/cluster feeding, and skin to skin. Education provided on anticipated breast changes, breast fullness and engorgement and management of both, nipple care, tips for attempts without shield as breast soften, output expectations for days ahead, and how to reach out for outpatient lactation services and community breastfeeding support.  Mom was encouraged to call out today for ongoing BF assistance as needed.  Maternal Data Formula Feeding for Exclusion: No Has patient been taught Hand Expression?: Yes Does the patient have breastfeeding experience prior to this delivery?: No  Feeding Feeding Type: Breast Fed  LATCH Score Latch: Repeated attempts needed to sustain latch, nipple held in mouth throughout feeding, stimulation needed to elicit sucking reflex.  Audible Swallowing: A few with stimulation  Type of Nipple: Flat  Comfort (Breast/Nipple): Filling, red/small blisters or bruises, mild/mod discomfort  Hold (Positioning): Assistance needed to correctly position infant at breast and maintain latch.  LATCH Score: 5  Interventions Interventions: Breast feeding basics reviewed  Lactation Tools Discussed/Used     Consult Status Consult Status: PRN Date: 08/31/20 Follow-up type: Call as needed    Danford Bad 08/31/2020, 10:09 AM

## 2020-08-31 NOTE — Anesthesia Postprocedure Evaluation (Signed)
Anesthesia Post Note  Patient: Microbiologist  Procedure(s) Performed: AN AD HOC LABOR EPIDURAL  Patient location during evaluation: Mother Baby Anesthesia Type: Epidural Level of consciousness: awake and alert Pain management: pain level controlled Vital Signs Assessment: post-procedure vital signs reviewed and stable Respiratory status: spontaneous breathing, nonlabored ventilation and respiratory function stable Cardiovascular status: stable Postop Assessment: no headache, no backache and epidural receding Anesthetic complications: no   No complications documented.   Last Vitals:  Vitals:   08/31/20 0300 08/31/20 0737  BP: 110/84 112/80  Pulse: 79 76  Resp: 18 20  Temp: 36.5 C 36.4 C  SpO2: 99% 98%    Last Pain:  Vitals:   08/31/20 0745  TempSrc:   PainSc: 0-No pain                 Tamari Redwine Lawerance Cruel

## 2020-08-31 NOTE — Progress Notes (Signed)
Subjective:  Doing well postpartum day Gay: She is tolerating regular diet. Her pain is controlled with PO medication. She is ambulating and voiding without difficulty. She is working with lactation and using a nipple shield due to flat nipples. She would like to discharge to home later today if all is going well and is fine staying until tomorrow if needed.  Objective:  Vital signs in last 24 hours: Temp:  [97.6 F (36.4 C)-100 F (37.8 C)] 97.6 F (36.4 C) (12/15 0737) Pulse Rate:  [76-122] 76 (12/15 0737) Resp:  [16-20] 20 (12/15 0737) BP: (109-128)/(64-92) 112/80 (12/15 0737) SpO2:  [95 %-100 %] 98 % (12/15 0737)    General: NAD Pulmonary: no increased work of breathing Abdomen: non-distended, non-tender, fundus firm at level of umbilicus Extremities: no edema, no erythema, no tenderness  Results for orders placed or performed during the hospital encounter of 08/29/20 (from the past 72 hour(s))  Resp Panel by RT-PCR (Flu A&B, Covid) Nasopharyngeal Swab     Status: None   Collection Time: 08/29/20  3:53 PM   Specimen: Nasopharyngeal Swab; Nasopharyngeal(NP) swabs in vial transport medium  Result Value Ref Range   SARS Coronavirus 2 by RT PCR NEGATIVE NEGATIVE    Comment: (NOTE) SARS-CoV-2 target nucleic acids are NOT DETECTED.  The SARS-CoV-2 RNA is generally detectable in upper respiratory specimens during the acute phase of infection. The lowest concentration of SARS-CoV-2 viral copies this assay can detect is 138 copies/mL. A negative result does not preclude SARS-Cov-2 infection and should not be used as the sole basis for treatment or other patient management decisions. A negative result may occur with  improper specimen collection/handling, submission of specimen other than nasopharyngeal swab, presence of viral mutation(s) within the areas targeted by this assay, and inadequate number of viral copies(<138 copies/mL). A negative result must be combined with clinical  observations, patient history, and epidemiological information. The expected result is Negative.  Fact Sheet for Patients:  BloggerCourse.com  Fact Sheet for Healthcare Providers:  SeriousBroker.it  This test is no t yet approved or cleared by the [place] FDA and  has been authorized for detection and/or diagnosis of SARS-CoV-2 by FDA under an Emergency Use Authorization (EUA). This EUA will remain  in effect (meaning this test can be used) for the duration of the COVID-19 declaration under Section 564(b)(Gay) of the Act, 21 U.S.C.section 360bbb-3(b)(Gay), unless the authorization is terminated  or revoked sooner.       Influenza A by PCR NEGATIVE NEGATIVE   Influenza B by PCR NEGATIVE NEGATIVE    Comment: (NOTE) The Xpert Xpress SARS-CoV-2/FLU/RSV plus assay is intended as an aid in the diagnosis of influenza from Nasopharyngeal swab specimens and should not be used as a sole basis for treatment. Nasal washings and aspirates are unacceptable for Xpert Xpress SARS-CoV-2/FLU/RSV testing.  Fact Sheet for Patients: BloggerCourse.com  Fact Sheet for Healthcare Providers: SeriousBroker.it  This test is not yet approved or cleared by the [place] FDA and has been authorized for detection and/or diagnosis of SARS-CoV-2 by FDA under an Emergency Use Authorization (EUA). This EUA will remain in effect (meaning this test can be used) for the duration of the COVID-19 declaration under Section 564(b)(Gay) of the Act, 21 U.S.C. section 360bbb-3(b)(Gay), unless the authorization is terminated or revoked.  Performed at [facility], [address] [place], [place] [address]   Protein / creatinine ratio, urine     Status: Abnormal   Collection Time: 08/29/20  4:23 PM  Result Value Ref Range   Creatinine, Urine 74 mg/dL   Total Protein, Urine 32 mg/dL    Comment: NO NORMAL  RANGE ESTABLISHED FOR THIS TEST   Protein Creatinine Ratio 0.43 (H) 0.00 - 0.15 mg/mg[Cre]    Comment: Performed at [facility], [address] [place], [place] [address]  Urinalysis, Complete w Microscopic Urine, Clean Catch     Status: Abnormal   Collection Time: 08/29/20  4:23 PM  Result Value Ref Range   Color, Urine YELLOW (A) YELLOW   APPearance HAZY (A) CLEAR   Specific Gravity, Urine Gay.013 Gay.005 - Gay.030   pH 7.0 5.0 - 8.0   Glucose, UA NEGATIVE NEGATIVE mg/dL   Hgb urine dipstick NEGATIVE NEGATIVE   Bilirubin Urine NEGATIVE NEGATIVE   Ketones, ur NEGATIVE NEGATIVE mg/dL   Protein, ur 30 (A) NEGATIVE mg/dL   Nitrite NEGATIVE NEGATIVE   Leukocytes,Ua SMALL (A) NEGATIVE   RBC / HPF 0-5 0 - 5 RBC/hpf   WBC, UA 0-5 0 - 5 WBC/hpf   Bacteria, UA RARE (A) NONE SEEN   Squamous Epithelial / LPF 0-5 0 - 5   Mucus PRESENT     Comment: Performed at [facility], [address] [place], [place] [address]  CBC     Status: Abnormal   Collection Time: 08/29/20  5:46 PM  Result Value Ref Range   WBC 10.4 4.0 - 10.5 K/uL   RBC 3.79 (L) 3.87 - 5.11 MIL/uL   Hemoglobin 12.2 12.0 - 15.0 g/dL   HCT 87.8 (L) 67.6 - 72.0 %   MCV 91.3 80.0 - 100.0 fL   MCH 32.2 26.0 - 34.0 pg   MCHC 35.3 30.0 - 36.0 g/dL   RDW 94.7 09.6 - 28.3 %   Platelets 239 150 - 400 K/uL   nRBC 0.0 0.0 - 0.2 %    Comment: Performed at [facility], [address] [place], [place] [address]  Type and screen [facility]     Status: None   Collection Time: 08/29/20  5:46 PM  Result Value Ref Range   ABO/RH(D) O POS    Antibody Screen NEG    Sample Expiration      09/01/2020,2359 Performed at [facility], [address] [place], [place] [address]   RPR     Status: None   Collection Time: 08/29/20  5:46 PM  Result Value Ref Range   RPR Ser Ql NON REACTIVE NON REACTIVE    Comment: Performed at [facility], [address], [place], [place]  [address]  Comprehensive metabolic panel     Status: Abnormal   Collection Time: 08/29/20  5:46 PM  Result Value Ref Range   Sodium 135 135 - 145 mmol/L   Potassium 4.2 3.5 - 5.Gay mmol/L   Chloride 102 98 - 111 mmol/L   CO2 23 22 - 32 mmol/L   Glucose, Bld 98 70 - 99 mg/dL    Comment: Glucose reference range applies only to samples taken after fasting for at least 8 hours.   BUN 16 6 - 20 mg/dL   Creatinine, Ser 6.56 0.44 - Gay.00 mg/dL   Calcium 9.5 8.9 - 81.2 mg/dL   Total Protein 7.Gay 6.5 - 8.Gay g/dL   Albumin 3.4 (L) 3.5 - 5.0 g/dL   AST 25 15 - 41 U/L   ALT 14 0 - 44 U/L   Alkaline Phosphatase 125 38 - 126 U/L   Total Bilirubin  0.5 0.3 - Gay.2 mg/dL   GFR, Estimated >75 >44 mL/min    Comment: (NOTE) Calculated using the CKD-EPI Creatinine Equation (2021)    Anion gap 10 5 - 15    Comment: Performed at [facility], [address] [place], [place] [address]  CBC     Status: Abnormal   Collection Time: 08/31/20  3:59 AM  Result Value Ref Range   WBC 13.8 (H) 4.0 - 10.5 K/uL   RBC 2.98 (L) 3.87 - 5.11 MIL/uL   Hemoglobin 9.7 (L) 12.0 - 15.0 g/dL   HCT 07.Gay (L) 21.9 - 75.8 %   MCV 91.6 80.0 - 100.0 fL   MCH 32.6 26.0 - 34.0 pg   MCHC 35.5 30.0 - 36.0 g/dL   RDW 83.2 54.9 - 82.6 %   Platelets 173 150 - 400 K/uL   nRBC 0.0 0.0 - 0.2 %    Comment: Performed at [facility], [address] [place], [place] [address]    Assessment:   29 y.o. G1P1001 postpartum day # Gay, Jean Gay  Plan:    Gay) Acute blood loss anemia - hemodynamically stable and asymptomatic - po ferrous sulfate  2) Blood Type --/--/O POS (12/13 1746) / Rubella <0.90 (05/12 1431) / Varicella Immune  3) TDAP status up to date  4) Feeding plan breast  5)  Education given regarding options for contraception, as well as compatibility with breast feeding if applicable.  Patient plans on Mirena IUD for contraception.  6) Disposition: continue current care and reevaluate for discharge later  today   Tresea Mall, CNM [facility] 08/31/2020, 10:42 AM

## 2020-08-31 NOTE — Progress Notes (Signed)
Pt discharged with infant.  Discharge instructions, prescriptions and follow up appointment given to and reviewed with pt. Pt verbalized understanding. Escorted out by auxillary. 

## 2020-09-02 ENCOUNTER — Encounter: Payer: 59 | Admitting: Obstetrics

## 2020-10-17 ENCOUNTER — Other Ambulatory Visit: Payer: Self-pay

## 2020-10-17 ENCOUNTER — Ambulatory Visit (INDEPENDENT_AMBULATORY_CARE_PROVIDER_SITE_OTHER): Payer: Self-pay | Admitting: Obstetrics and Gynecology

## 2020-10-17 DIAGNOSIS — Z30430 Encounter for insertion of intrauterine contraceptive device: Secondary | ICD-10-CM

## 2020-10-17 NOTE — Progress Notes (Signed)
Postpartum Visit   Chief Complaint  Patient presents with  . Postpartum Care   History of Present Illness: Patient is a 30 y.o. G1P1001 presents for postpartum visit.  Date of delivery: 08/30/2020 Type of delivery: Vaginal delivery - Vacuum or forceps assisted  no Episiotomy No.  Laceration: yes-2nd degree Pregnancy or labor problems:  no Any problems since the delivery:  no  Newborn Details:  SINGLETON :  1. Baby's name: Doreene Eland. Birth weight: 7.4lb Maternal Details:  Breast Feeding: Bottle Post partum depression/anxiety noted: no Edinburgh Post-Partum Depression Score:  9  Date of last PAP: 02/03/2020-normal  Past Medical History:  Diagnosis Date  . Venomous snake bite 03/11/2017    No past surgical history on file.  Prior to Admission medications   Medication Sig Start Date End Date Taking? Authorizing Provider  Prenatal Vit-Fe Fumarate-FA (MULTIVITAMIN-PRENATAL) 27-0.8 MG TABS tablet Take 1 tablet by mouth daily at 12 noon.    [provider]    Allergies  Allergen Reactions  . Oseltamivir     Dizzy Dizzy      Social History   Socioeconomic History  . Marital status: Significant Other    Spouse name: Salome Arnt  . Number of children: Not on file  . Years of education: Not on file  . Highest education level: Not on file  Occupational History  . Not on file  Tobacco Use  . Smoking status: Never Smoker  . Smokeless tobacco: Never Used  Vaping Use  . Vaping Use: Never used  Substance and Sexual Activity  . Alcohol use: Not Currently  . Drug use: Never  . Sexual activity: Yes    Birth control/protection: None  Other Topics Concern  . Not on file  Social History Narrative  . Not on file   Social Determinants of Health   Financial Resource Strain: Not on file  Food Insecurity: Not on file  Transportation Needs: Not on file  Physical Activity: Not on file  Stress: Not on file  Social Connections: Not on file  Intimate Partner Violence:  Not on file    Family History  Problem Relation Age of Onset  . Diabetes Maternal Grandfather   . Hypertension Maternal Grandfather   . Heart disease Maternal Grandfather     Review of Systems  Constitutional: Negative.   HENT: Negative.   Eyes: Negative.   Respiratory: Negative.   Cardiovascular: Negative.   Gastrointestinal: Negative.   Genitourinary: Negative.   Musculoskeletal: Negative.   Skin: Negative.   Neurological: Negative.   Psychiatric/Behavioral: Negative.      Physical Exam BP 124/76   Ht 5\' 4"  (1.626 m)   Wt 176 lb (79.8 kg)   LMP 10/11/2020   BMI 30.21 kg/m   Physical Exam Constitutional:      General: She is not in acute distress.    Appearance: Normal appearance. She is well-developed.  Genitourinary:     Vulva, bladder and urethral meatus normal.     Right Labia: No rash, tenderness, lesions, skin changes or Bartholin's cyst.    Left Labia: No tenderness, skin changes, Bartholin's cyst or rash.    No inguinal adenopathy present in the right or left side.    Pelvic Tanner Score: 5/5.     Right Adnexa: not tender, not full and no mass present.    Left Adnexa: not tender, not full and no mass present.    No cervical motion tenderness, friability, lesion or polyp.     Uterus is not enlarged, fixed  or tender.     Uterus is anteverted.     No urethral tenderness or mass present.     Pelvic exam was performed with patient in the lithotomy position.  HENT:     Head: Normocephalic and atraumatic.  Eyes:     General: No scleral icterus.    Conjunctiva/sclera: Conjunctivae normal.  Cardiovascular:     Rate and Rhythm: Normal rate and regular rhythm.     Heart sounds: No murmur heard. No friction rub. No gallop.   Pulmonary:     Effort: Pulmonary effort is normal. No respiratory distress.     Breath sounds: Normal breath sounds. No wheezing or rales.  Abdominal:     General: Bowel sounds are normal. There is no distension.     Palpations: Abdomen  is soft. There is no mass.     Tenderness: There is no abdominal tenderness. There is no guarding or rebound.     Hernia: There is no hernia in the left inguinal area or right inguinal area.  Musculoskeletal:        General: Normal range of motion.     Cervical back: Normal range of motion and neck supple.  Lymphadenopathy:     Lower Body: No right inguinal adenopathy. No left inguinal adenopathy.  Neurological:     General: No focal deficit present.     Mental Status: She is alert and oriented to person, place, and time.     Cranial Nerves: No cranial nerve deficit.  Skin:    General: Skin is warm and dry.     Findings: No erythema.  Psychiatric:        Mood and Affect: Mood normal.        Behavior: Behavior normal.        Judgment: Judgment normal.    IUD Insertion Procedure Note (Mirena) Patient identified, informed consent performed, consent signed.   Discussed risks of irregular bleeding, cramping, infection, malpositioning, expulsion or uterine perforation of the IUD (1:1000 placements)  which may require further procedure such as laparoscopy.  IUD while effective at preventing pregnancy do not prevent transmission of sexually transmitted diseases and use of barrier methods for this purpose was discussed. Time out was performed.  Urine pregnancy test negative.  Speculum placed in the vagina.  Cervix visualized.  Cleaned with Betadine x 2.  Grasped anteriorly with a single tooth tenaculum.  Uterus sounded to 8.5 cm. IUD placed per manufacturer's recommendations.  Strings trimmed to 3 cm. Tenaculum was removed, good hemostasis noted.  Patient tolerated procedure well.   Patient was given post-procedure instructions.  She was advised to have backup contraception for one week.  Patient was also asked to check IUD strings periodically and follow up in 6 weeks for IUD check.    Female Chaperone present during breast and/or pelvic exam.  Assessment: 30 y.o. G1P1001 presenting for 6 week  postpartum visit  Plan: Problem List Items Addressed This Visit   None   Visit Diagnoses    Postpartum care and examination    -  Primary   Relevant Medications   levonorgestrel (MIRENA) 20 MCG/24HR IUD   Encounter for IUD insertion       Relevant Medications   levonorgestrel (MIRENA) 20 MCG/24HR IUD       1) Contraception: Mirena IUD placed today  2)  Pap - ASCCP guidelines and rational discussed.  Patient opts for routine screening interval  3) Patient underwent screening for postpartum depression with no concerns noted.  Her  score was mildly elevated. However, she states that she is doing quite well, though she had a tougher time at first. She was encouraged to contact me, if she develops worsening symptoms.   Return in about 4 weeks (around 11/14/2020) for IUD String Check.    Thomasene Mohair, MD 10/17/2020 10:36 AM

## 2020-10-18 ENCOUNTER — Encounter: Payer: Self-pay | Admitting: Obstetrics and Gynecology

## 2020-10-18 MED ORDER — LEVONORGESTREL 20 MCG/24HR IU IUD
1.0000 | INTRAUTERINE_SYSTEM | Freq: Once | INTRAUTERINE | 0 refills | Status: DC
Start: 2020-10-18 — End: 2022-02-19

## 2020-11-14 ENCOUNTER — Other Ambulatory Visit: Payer: Self-pay

## 2020-11-14 ENCOUNTER — Ambulatory Visit (INDEPENDENT_AMBULATORY_CARE_PROVIDER_SITE_OTHER): Payer: Medicaid Other | Admitting: Obstetrics and Gynecology

## 2020-11-14 ENCOUNTER — Encounter: Payer: Self-pay | Admitting: Obstetrics and Gynecology

## 2020-11-14 VITALS — BP 122/70 | Wt 183.0 lb

## 2020-11-14 DIAGNOSIS — Z30431 Encounter for routine checking of intrauterine contraceptive device: Secondary | ICD-10-CM

## 2020-11-14 NOTE — Progress Notes (Signed)
IUD String Check  Subjctive: [name] presents for IUD string check.  She had a Mirena placed 4 weeks ago.  Since placement of her IUD she had intermittent vaginal bleeding.  She denies cramping or discomfort.  She has had intercourse since placement.  She has not checked the strings.  She denies any fever, chills, nausea, vomiting, or other complaints.    Objective: BP 122/70   Wt 183 lb (83 kg)   BMI 31.41 kg/m  Physical Exam Constitutional:      General: She is not in acute distress.    Appearance: Normal appearance. She is well-developed.  Genitourinary:     Vulva, bladder and urethral meatus normal.     Right Labia: No rash, tenderness, lesions, skin changes or Bartholin's cyst.    Left Labia: No tenderness, skin changes, Bartholin's cyst or rash.    No inguinal adenopathy present in the right or left side.    Pelvic Tanner Score: 5/5.     Right Adnexa: not tender, not full and no mass present.    Left Adnexa: not tender, not full and no mass present.    No cervical motion tenderness, friability, lesion or polyp.     IUD strings visualized.     Uterus is not enlarged, fixed or tender.     Uterus is anteverted.     No urethral tenderness or mass present.     Pelvic exam was performed with patient in the lithotomy position.  HENT:     Head: Normocephalic and atraumatic.  Eyes:     General: No scleral icterus.    Conjunctiva/sclera: Conjunctivae normal.  Cardiovascular:     Rate and Rhythm: Normal rate and regular rhythm.     Heart sounds: No murmur heard. No friction rub. No gallop.   Pulmonary:     Effort: Pulmonary effort is normal. No respiratory distress.     Breath sounds: Normal breath sounds. No wheezing or rales.  Abdominal:     General: Bowel sounds are normal. There is no distension.     Palpations: Abdomen is soft. There is no mass.     Tenderness: There is no abdominal tenderness. There is no guarding or rebound.     Hernia: There is no  hernia in the left inguinal area or right inguinal area.  Musculoskeletal:        General: Normal range of motion.     Cervical back: Normal range of motion and neck supple.  Lymphadenopathy:     Lower Body: No right inguinal adenopathy. No left inguinal adenopathy.  Neurological:     General: No focal deficit present.     Mental Status: She is alert and oriented to person, place, and time.     Cranial Nerves: No cranial nerve deficit.  Skin:    General: Skin is warm and dry.     Findings: No erythema.  Psychiatric:        Mood and Affect: Mood normal.        Behavior: Behavior normal.        Judgment: Judgment normal.      Female chaperone was present for the entirety of the pelvic exam  Assessment: 30 y.o. year old female status post prior Mirena IUD placement 4 week ago, doing well.  Plan: 1.  The patient was given instructions to check her IUD strings monthly and call with any problems or concerns.  She should call for fevers, chills, abnormal vaginal discharge, pelvic pain, or  other complaints. 2.  She will return for a annual exam in 1 year.  All questions answered.  20 minutes spent in face to face discussion with > 50% spent in counseling, management, and coordination of care for her newly-placed IUD.  Risks and benefits of IUD discussed including the risks of irregular bleeding, cramping, infection, malpositioning, expulsion, which may require further procedures such as laparoscopy.  IUDs while effective at preventing pregnancy do not prevent transmission of sexually transmitted diseases and use of barrier methods for this purpose was discussed.  Low overall incidence of failure with 99.7% efficacy rate in typical use.    Thomasene Mohair, MD 11/14/2020 11:19 AM

## 2021-10-13 NOTE — Progress Notes (Signed)
BP 116/80    Pulse 79    Temp 98.6 F (37 C) (Oral)    Ht 5' 4.69" (1.643 m)    Wt 183 lb 3.2 oz (83.1 kg)    SpO2 98%    BMI 30.78 kg/m    Subjective:    Patient ID: Jean Gay, female    DOB: Feb 04, 1991, 31 y.o.   MRN: [id number]  HPI: Jean Gay is a 31 y.o. female  Chief Complaint  Patient presents with   New Patient (Initial Visit)    Feels like her hormones are not where they should be after baby was born 13 months ago, feels like she is on a emotional roller coaster.  Patient states that her toes and finger tips turn purple all the time even when it is warm out.      Patient presents to clinic to establish care with new PCP.  Introduced to Publishing rights manager role and practice setting.  All questions answered.  Discussed provider/patient relationship and expectations.  Patient reports a history of lactose intolerant and she sometimes has constipation, depression and anxiety.  Patient denies a history of: Hypertension, Elevated Cholesterol, Diabetes, Thyroid problems, Depression, Anxiety, Neurological problems, and Abdominal problems.   Patient states she had a baby 13 months ago and since then she feels like her hormones have been off. She did have post partum depression which has improved. Patient did not take any medication.  Patient states she does not have thoughts of harming herself but she does sometimes feel like her family would be better off without her.  Flowsheet Row Office Visit from 10/16/2021 in [facility]  PHQ-9 Total Score 23      GAD 7 : Generalized Anxiety Score 10/16/2021  Nervous, Anxious, on Edge 2  Control/stop worrying 3  Worry too much - different things 3  Trouble relaxing 2  Restless 2  Easily annoyed or irritable 3  Afraid - awful might happen 3  Total GAD 7 Score 18  Anxiety Difficulty Very difficult      Active Ambulatory Problems    Diagnosis Date Noted   Supervision of other normal pregnancy, antepartum 01/27/2020    Menorrhagia 02/03/2020   PMDD (premenstrual dysphoric disorder) 02/03/2020   [redacted] weeks gestation of pregnancy 08/29/2020   Labor without complication 08/29/2020   Encounter for care or examination of lactating mother 08/31/2020   Postpartum care following vaginal delivery 08/31/2020   Depression, recurrent (HCC) 10/16/2021   Anxiety 10/16/2021   Resolved Ambulatory Problems    Diagnosis Date Noted   Venomous snake bite 03/11/2017   Indication for care or intervention related to labor and delivery 08/30/2020   No Additional Past Medical History   History reviewed. No pertinent surgical history. Family History  Problem Relation Age of Onset   Diabetes Maternal Grandfather    Hypertension Maternal Grandfather    Heart disease Maternal Grandfather      Review of Systems  Endocrine:       Purple fingers  Psychiatric/Behavioral:  Positive for dysphoric mood. Negative for suicidal ideas. The patient is nervous/anxious.    Per HPI unless specifically indicated above     Objective:    BP 116/80    Pulse 79    Temp 98.6 F (37 C) (Oral)    Ht 5' 4.69" (1.643 m)    Wt 183 lb 3.2 oz (83.1 kg)    SpO2 98%    BMI 30.78 kg/m   Wt Readings from Last 3 Encounters:  10/16/21 183 lb 3.2 oz (83.1 kg)  11/14/20 183 lb (83 kg)  10/17/20 176 lb (79.8 kg)    Physical Exam Vitals and nursing note reviewed.  Constitutional:      General: She is not in acute distress.    Appearance: Normal appearance. She is normal weight. She is not ill-appearing, toxic-appearing or diaphoretic.  HENT:     Head: Normocephalic.     Right Ear: External ear normal.     Left Ear: External ear normal.     Nose: Nose normal.     Mouth/Throat:     Mouth: Mucous membranes are moist.     Pharynx: Oropharynx is clear.  Eyes:     General:        Right eye: No discharge.        Left eye: No discharge.     Extraocular Movements: Extraocular movements intact.     Conjunctiva/sclera: Conjunctivae normal.      Pupils: Pupils are equal, round, and reactive to light.  Cardiovascular:     Rate and Rhythm: Normal rate and regular rhythm.     Heart sounds: No murmur heard. Pulmonary:     Effort: Pulmonary effort is normal. No respiratory distress.     Breath sounds: Normal breath sounds. No wheezing or rales.  Musculoskeletal:     Cervical back: Normal range of motion and neck supple.  Skin:    General: Skin is warm and dry.     Capillary Refill: Capillary refill takes less than 2 seconds.  Neurological:     General: No focal deficit present.     Mental Status: She is alert and oriented to person, place, and time. Mental status is at baseline.  Psychiatric:        Mood and Affect: Mood normal.        Behavior: Behavior normal.        Thought Content: Thought content normal.        Judgment: Judgment normal.    Results for orders placed or performed during the hospital encounter of 08/29/20  Resp Panel by RT-PCR (Flu A&B, Covid) Nasopharyngeal Swab   Specimen: Nasopharyngeal Swab; Nasopharyngeal(NP) swabs in vial transport medium  Result Value Ref Range   SARS Coronavirus 2 by RT PCR NEGATIVE NEGATIVE   Influenza A by PCR NEGATIVE NEGATIVE   Influenza B by PCR NEGATIVE NEGATIVE  CBC  Result Value Ref Range   WBC 10.4 4.0 - 10.5 K/uL   RBC 3.79 (L) 3.87 - 5.11 MIL/uL   Hemoglobin 12.2 12.0 - 15.0 g/dL   HCT 15.1 (L) 76.1 - 60.7 %   MCV 91.3 80.0 - 100.0 fL   MCH 32.2 26.0 - 34.0 pg   MCHC 35.3 30.0 - 36.0 g/dL   RDW 37.1 06.2 - 69.4 %   Platelets 239 150 - 400 K/uL   nRBC 0.0 0.0 - 0.2 %  RPR  Result Value Ref Range   RPR Ser Ql NON REACTIVE NON REACTIVE  Protein / creatinine ratio, urine  Result Value Ref Range   Creatinine, Urine 74 mg/dL   Total Protein, Urine 32 mg/dL   Protein Creatinine Ratio 0.43 (H) 0.00 - 0.15 mg/mg[Cre]  Urinalysis, Complete w Microscopic Urine, Clean Catch  Result Value Ref Range   Color, Urine YELLOW (A) YELLOW   APPearance HAZY (A) CLEAR    Specific Gravity, Urine 1.013 1.005 - 1.030   pH 7.0 5.0 - 8.0   Glucose, UA NEGATIVE NEGATIVE mg/dL   Hgb  urine dipstick NEGATIVE NEGATIVE   Bilirubin Urine NEGATIVE NEGATIVE   Ketones, ur NEGATIVE NEGATIVE mg/dL   Protein, ur 30 (A) NEGATIVE mg/dL   Nitrite NEGATIVE NEGATIVE   Leukocytes,Ua SMALL (A) NEGATIVE   RBC / HPF 0-5 0 - 5 RBC/hpf   WBC, UA 0-5 0 - 5 WBC/hpf   Bacteria, UA RARE (A) NONE SEEN   Squamous Epithelial / LPF 0-5 0 - 5   Mucus PRESENT   Comprehensive metabolic panel  Result Value Ref Range   Sodium 135 135 - 145 mmol/L   Potassium 4.2 3.5 - 5.1 mmol/L   Chloride 102 98 - 111 mmol/L   CO2 23 22 - 32 mmol/L   Glucose, Bld 98 70 - 99 mg/dL   BUN 16 6 - 20 mg/dL   Creatinine, Ser 1.610.95 0.44 - 1.00 mg/dL   Calcium 9.5 8.9 - 09.610.3 mg/dL   Total Protein 7.1 6.5 - 8.1 g/dL   Albumin 3.4 (L) 3.5 - 5.0 g/dL   AST 25 15 - 41 U/L   ALT 14 0 - 44 U/L   Alkaline Phosphatase 125 38 - 126 U/L   Total Bilirubin 0.5 0.3 - 1.2 mg/dL   GFR, Estimated >04>60 >54>60 mL/min   Anion gap 10 5 - 15  OB RESULTS CONSOLE Varicella zoster antibody, IgG  Result Value Ref Range   Varicella Nonimmune   CBC  Result Value Ref Range   WBC 13.8 (H) 4.0 - 10.5 K/uL   RBC 2.98 (L) 3.87 - 5.11 MIL/uL   Hemoglobin 9.7 (L) 12.0 - 15.0 g/dL   HCT 09.827.3 (L) 11.936.0 - 14.746.0 %   MCV 91.6 80.0 - 100.0 fL   MCH 32.6 26.0 - 34.0 pg   MCHC 35.5 30.0 - 36.0 g/dL   RDW 82.912.2 56.211.5 - 13.015.5 %   Platelets 173 150 - 400 K/uL   nRBC 0.0 0.0 - 0.2 %  Type and screen [facility]  Result Value Ref Range   ABO/RH(D) O POS    Antibody Screen NEG    Sample Expiration      09/01/2020,2359 Performed at [facility], [address] [place], [place] [address]       Assessment & Plan:   Problem List Items Addressed This Visit       Other   PMDD (premenstrual dysphoric disorder)    Suffered from post partum depression but did not receive treatment.  Likely exacerbated at this time.  Will draw labs and possible discuss treatment with SSRI after lab results are back.       Relevant Orders   Rheumatoid factor   ANA   ANA+ENA+DNA/DS+Antich+Centr   CK   Rocky mtn spotted fvr abs pnl(IgG+IgM)   Uric acid   Ehrlichia antibody panel   Babesia microti Antibody Panel   Comprehensive metabolic panel   Vitamin D (25 hydroxy)   Anemia Profile B   FSH/LH   Estradiol   Depression, recurrent (HCC) - Primary    Chronic. Not well controlled.  Will draw labs at visit today to rule out other causes of worsening depression and anxiety.  If lab work is normal, will discuss medication options.  Will follow up in 2 weeks for lab result discussion.      Relevant Orders   Rheumatoid factor   ANA   ANA+ENA+DNA/DS+Antich+Centr   CK   Rocky mtn spotted fvr abs pnl(IgG+IgM)   Uric acid   Ehrlichia antibody panel   Babesia microti Antibody  Panel   Comprehensive metabolic panel   Vitamin D (25 hydroxy)   Anemia Profile B   FSH/LH   Estradiol   Anxiety    Chronic. Not well controlled.  Will draw labs at visit today to rule out other causes of worsening depression and anxiety.  If lab work is normal, will discuss medication options.  Will follow up in 2 weeks for lab result discussion.      Relevant Orders   Rheumatoid factor   ANA   ANA+ENA+DNA/DS+Antich+Centr   CK   Rocky mtn spotted fvr abs pnl(IgG+IgM)   Uric acid   Ehrlichia antibody panel   Babesia microti Antibody Panel   Comprehensive metabolic panel   Vitamin D (25 hydroxy)   Anemia Profile B   FSH/LH   Estradiol   Other Visit Diagnoses     Other fatigue       Will draw labs at visit today. Will make recommendations based on lab results.   Relevant Orders   Rheumatoid factor   ANA   ANA+ENA+DNA/DS+Antich+Centr   CK   Rocky mtn spotted fvr abs pnl(IgG+IgM)   Uric acid   Ehrlichia antibody panel   Babesia microti Antibody Panel   Comprehensive metabolic panel   Vitamin D (25 hydroxy)   Anemia Profile B    FSH/LH   Estradiol   Need for hepatitis C screening test       Relevant Orders   Hepatitis C antibody, reflex   Need for influenza vaccination       Relevant Orders   Flu Vaccine QUAD 1575mo+IM (Fluarix, Fluzone & Alfiuria Quad PF) (Completed)   Encounter to establish care            Follow up plan: Return in about 2 weeks (around 10/30/2021) for Depression/Anxiety FU.

## 2021-10-16 ENCOUNTER — Ambulatory Visit (INDEPENDENT_AMBULATORY_CARE_PROVIDER_SITE_OTHER): Payer: 59 | Admitting: Nurse Practitioner

## 2021-10-16 ENCOUNTER — Encounter: Payer: Self-pay | Admitting: Nurse Practitioner

## 2021-10-16 ENCOUNTER — Other Ambulatory Visit: Payer: Self-pay

## 2021-10-16 VITALS — BP 116/80 | HR 79 | Temp 98.6°F | Ht 64.69 in | Wt 183.2 lb

## 2021-10-16 DIAGNOSIS — Z23 Encounter for immunization: Secondary | ICD-10-CM

## 2021-10-16 DIAGNOSIS — Z1159 Encounter for screening for other viral diseases: Secondary | ICD-10-CM

## 2021-10-16 DIAGNOSIS — F339 Major depressive disorder, recurrent, unspecified: Secondary | ICD-10-CM | POA: Diagnosis not present

## 2021-10-16 DIAGNOSIS — R5383 Other fatigue: Secondary | ICD-10-CM

## 2021-10-16 DIAGNOSIS — F3281 Premenstrual dysphoric disorder: Secondary | ICD-10-CM

## 2021-10-16 DIAGNOSIS — Z7689 Persons encountering health services in other specified circumstances: Secondary | ICD-10-CM | POA: Diagnosis not present

## 2021-10-16 DIAGNOSIS — F419 Anxiety disorder, unspecified: Secondary | ICD-10-CM | POA: Insufficient documentation

## 2021-10-16 NOTE — Assessment & Plan Note (Signed)
Suffered from post partum depression but did not receive treatment.  Likely exacerbated at this time. Will draw labs and possible discuss treatment with SSRI after lab results are back.

## 2021-10-16 NOTE — Assessment & Plan Note (Signed)
Chronic. Not well controlled.  Will draw labs at visit today to rule out other causes of worsening depression and anxiety.  If lab work is normal, will discuss medication options.  Will follow up in 2 weeks for lab result discussion.

## 2021-10-16 NOTE — Assessment & Plan Note (Signed)
Chronic. Not well controlled.  Will draw labs at visit today to rule out other causes of worsening depression and anxiety.  If lab work is normal, will discuss medication options.  Will follow up in 2 weeks for lab result discussion. °

## 2021-10-19 NOTE — Progress Notes (Signed)
Hi Jean Gay.  All but one of your labs have returned.  As you can see your Rheumatoid factor is elevated.  However, every other autoimmune lab that I drew was negative.  This likely means it was a false positive and elevated due to acute stress.  With that said, we will follow up in 2 weeks and discuss medication options.  We will also repeat the lab in 6 weeks to ensure it is coming down. If it is still elevated I will have you see a rheumatologist for further evaluation.  Please let me know if you have any questions.

## 2021-10-20 ENCOUNTER — Encounter: Payer: Self-pay | Admitting: Nurse Practitioner

## 2021-10-21 LAB — RHEUMATOID FACTOR: Rheumatoid fact SerPl-aCnc: 72.3 IU/mL — ABNORMAL HIGH (ref <14.0)

## 2021-10-21 LAB — ROCKY MTN SPOTTED FVR ABS PNL(IGG+IGM)
RMSF IgG: UNDETERMINED
RMSF IgM: 0.42 index (ref 0.00–0.89)

## 2021-10-21 LAB — EHRLICHIA ANTIBODY PANEL
E. Chaffeensis (HME) IgM Titer: NEGATIVE
E.Chaffeensis (HME) IgG: NEGATIVE
HGE IgG Titer: NEGATIVE
HGE IgM Titer: NEGATIVE

## 2021-10-21 LAB — ANEMIA PROFILE B
Basophils Absolute: 0 10*3/uL (ref 0.0–0.2)
Basos: 1 %
EOS (ABSOLUTE): 0.1 10*3/uL (ref 0.0–0.4)
Eos: 1 %
Ferritin: 42 ng/mL (ref 15–150)
Folate: 7 ng/mL (ref >3.0)
Hematocrit: 41.1 % (ref 34.0–46.6)
Hemoglobin: 13.8 g/dL (ref 11.1–15.9)
Immature Grans (Abs): 0 10*3/uL (ref 0.0–0.1)
Immature Granulocytes: 0 %
Iron Saturation: 25 % (ref 15–55)
Iron: 83 ug/dL (ref 27–159)
Lymphocytes Absolute: 2.2 10*3/uL (ref 0.7–3.1)
Lymphs: 41 %
MCH: 31.8 pg (ref 26.6–33.0)
MCHC: 33.6 g/dL (ref 31.5–35.7)
MCV: 95 fL (ref 79–97)
Monocytes Absolute: 0.4 10*3/uL (ref 0.1–0.9)
Monocytes: 8 %
Neutrophils Absolute: 2.6 10*3/uL (ref 1.4–7.0)
Neutrophils: 49 %
Platelets: 255 10*3/uL (ref 150–450)
RBC: 4.34 x10E6/uL (ref 3.77–5.28)
RDW: 12 % (ref 11.7–15.4)
Retic Ct Pct: 2.1 % (ref 0.6–2.6)
Total Iron Binding Capacity: 328 ug/dL (ref 250–450)
UIBC: 245 ug/dL (ref 131–425)
Vitamin B-12: 311 pg/mL (ref 232–1245)
WBC: 5.3 10*3/uL (ref 3.4–10.8)

## 2021-10-21 LAB — COMPREHENSIVE METABOLIC PANEL
ALT: 21 IU/L (ref 0–32)
AST: 19 IU/L (ref 0–40)
Albumin/Globulin Ratio: 2.2 (ref 1.2–2.2)
Albumin: 5.1 g/dL — ABNORMAL HIGH (ref 3.9–5.0)
Alkaline Phosphatase: 81 IU/L (ref 44–121)
BUN/Creatinine Ratio: 12 (ref 9–23)
BUN: 11 mg/dL (ref 6–20)
Bilirubin Total: 0.3 mg/dL (ref 0.0–1.2)
CO2: 26 mmol/L (ref 20–29)
Calcium: 9.5 mg/dL (ref 8.7–10.2)
Chloride: 102 mmol/L (ref 96–106)
Creatinine, Ser: 0.92 mg/dL (ref 0.57–1.00)
Globulin, Total: 2.3 g/dL (ref 1.5–4.5)
Glucose: 82 mg/dL (ref 70–99)
Potassium: 4.3 mmol/L (ref 3.5–5.2)
Sodium: 138 mmol/L (ref 134–144)
Total Protein: 7.4 g/dL (ref 6.0–8.5)
eGFR: 86 mL/min/{1.73_m2} (ref >59)

## 2021-10-21 LAB — BABESIA MICROTI ANTIBODY PANEL
Babesia microti IgG: <1:10 {titer}
Babesia microti IgM: <1:10 {titer}

## 2021-10-21 LAB — ANA+ENA+DNA/DS+ANTICH+CENTR
ANA Titer 1: NEGATIVE
Anti JO-1: <0.2 AI (ref 0.0–0.9)
Centromere Ab Screen: <0.2 AI (ref 0.0–0.9)
Chromatin Ab SerPl-aCnc: <0.2 AI (ref 0.0–0.9)
ENA RNP Ab: <0.2 AI (ref 0.0–0.9)
ENA SM Ab Ser-aCnc: <0.2 AI (ref 0.0–0.9)
ENA SSA (RO) Ab: <0.2 AI (ref 0.0–0.9)
ENA SSB (LA) Ab: <0.2 AI (ref 0.0–0.9)
Scleroderma (Scl-70) (ENA) Antibody, IgG: <0.2 AI (ref 0.0–0.9)
dsDNA Ab: <1 IU/mL (ref 0–9)

## 2021-10-21 LAB — RMSF, IGG, IFA: RMSF, IGG, IFA: <1:64 {titer}

## 2021-10-21 LAB — ESTRADIOL: Estradiol: 74.2 pg/mL

## 2021-10-21 LAB — FSH/LH
FSH: 6 m[IU]/mL
LH: 11.9 m[IU]/mL

## 2021-10-21 LAB — ANA: Anti Nuclear Antibody (ANA): NEGATIVE

## 2021-10-21 LAB — CK: Total CK: 71 U/L (ref 32–182)

## 2021-10-21 LAB — URIC ACID: Uric Acid: 5.5 mg/dL (ref 2.6–6.2)

## 2021-10-21 LAB — VITAMIN D 25 HYDROXY (VIT D DEFICIENCY, FRACTURES): Vit D, 25-Hydroxy: 30 ng/mL (ref 30.0–100.0)

## 2021-10-30 ENCOUNTER — Encounter: Payer: Self-pay | Admitting: Nurse Practitioner

## 2021-10-30 ENCOUNTER — Other Ambulatory Visit: Payer: Self-pay

## 2021-10-30 ENCOUNTER — Ambulatory Visit: Payer: 59 | Admitting: Nurse Practitioner

## 2021-10-30 VITALS — BP 106/72 | HR 71 | Temp 98.7°F | Wt 186.6 lb

## 2021-10-30 DIAGNOSIS — F419 Anxiety disorder, unspecified: Secondary | ICD-10-CM

## 2021-10-30 DIAGNOSIS — F339 Major depressive disorder, recurrent, unspecified: Secondary | ICD-10-CM | POA: Diagnosis not present

## 2021-10-30 DIAGNOSIS — Z712 Person consulting for explanation of examination or test findings: Secondary | ICD-10-CM | POA: Diagnosis not present

## 2021-10-30 MED ORDER — SERTRALINE HCL 25 MG PO TABS: 25.0000 mg | ORAL_TABLET | Freq: Every day | ORAL | 0 refills | Status: DC

## 2021-10-30 NOTE — Assessment & Plan Note (Signed)
Chronic.  Uncontrolled.  Will start Zoloft 25mg daily.  Side effects and benefits of medication discussed during visit. Return to clinic in 2 weeks for reevaluation.  Call sooner if concerns arise.  °

## 2021-10-30 NOTE — Progress Notes (Signed)
BP 106/72    Pulse 71    Temp 98.7 F (37.1 C) (Oral)    Wt 186 lb 9.6 oz (84.6 kg)    LMP 10/28/2021 (Exact Date)    SpO2 99%    BMI 31.36 kg/m    Subjective:    Patient ID: Jean Gay, female    DOB: 30-Oct-1990, 30 y.o.   MRN: [id number]  HPI: Jean Gay is a 31 y.o. female  Chief Complaint  Patient presents with   Depression    2 week f/up    Patient presents to clinic to review lab results from previous results.   DEPRESSION/ANXIETY Patient states her depression and anxiety are ongoing.  She states she feels like she would benefit from medication.  She has been on Prozac before and it made her feel numb.  Denies SI.    [place] Office Visit from 10/30/2021 in [place]  PHQ-9 Total Score 19       GAD 7 : Generalized Anxiety Score 10/30/2021 10/16/2021  Nervous, Anxious, on Edge 3 2  Control/stop worrying 3 3  Worry too much - different things 3 3  Trouble relaxing 3 2  Restless 2 2  Easily annoyed or irritable 3 3  Afraid - awful might happen 1 3  Total GAD 7 Score 18 18  Anxiety Difficulty Very difficult Very difficult     Relevant past medical, surgical, family and social history reviewed and updated as indicated. Interim medical history since our last visit reviewed. Allergies and medications reviewed and updated.  Review of Systems  Psychiatric/Behavioral:  Positive for dysphoric mood. Negative for suicidal ideas. The patient is nervous/anxious.    Per HPI unless specifically indicated above     Objective:    BP 106/72    Pulse 71    Temp 98.7 F (37.1 C) (Oral)    Wt 186 lb 9.6 oz (84.6 kg)    LMP 10/28/2021 (Exact Date)    SpO2 99%    BMI 31.36 kg/m   Wt Readings from Last 3 Encounters:  10/30/21 186 lb 9.6 oz (84.6 kg)  10/16/21 183 lb 3.2 oz (83.1 kg)  11/14/20 183 lb (83 kg)    Physical Exam Vitals and nursing note reviewed.  Constitutional:      General: She is not in acute distress.    Appearance: Normal  appearance. She is normal weight. She is not ill-appearing, toxic-appearing or diaphoretic.  HENT:     Head: Normocephalic.     Right Ear: External ear normal.     Left Ear: External ear normal.     Nose: Nose normal.     Mouth/Throat:     Mouth: Mucous membranes are moist.     Pharynx: Oropharynx is clear.  Eyes:     General:        Right eye: No discharge.        Left eye: No discharge.     Extraocular Movements: Extraocular movements intact.     Conjunctiva/sclera: Conjunctivae normal.     Pupils: Pupils are equal, round, and reactive to light.  Cardiovascular:     Rate and Rhythm: Normal rate and regular rhythm.     Heart sounds: No murmur heard. Pulmonary:     Effort: Pulmonary effort is normal. No respiratory distress.     Breath sounds: Normal breath sounds. No wheezing or rales.  Musculoskeletal:     Cervical back: Normal range of motion and neck supple.  Skin:  General: Skin is warm and dry.     Capillary Refill: Capillary refill takes less than 2 seconds.  Neurological:     General: No focal deficit present.     Mental Status: She is alert and oriented to person, place, and time. Mental status is at baseline.  Psychiatric:        Mood and Affect: Mood normal.        Behavior: Behavior normal.        Thought Content: Thought content normal.        Judgment: Judgment normal.    Results for orders placed or performed in visit on 10/16/21  Rheumatoid factor  Result Value Ref Range   Rhuematoid fact SerPl-aCnc 72.3 (H) <14.0 IU/mL  ANA  Result Value Ref Range   Anti Nuclear Antibody (ANA) Negative Negative  ANA+ENA+DNA/DS+Antich+Centr  Result Value Ref Range   ANA Titer 1 Negative    dsDNA Ab <1 0 - 9 IU/mL   ENA RNP Ab <0.2 0.0 - 0.9 AI   ENA SM Ab Ser-aCnc <0.2 0.0 - 0.9 AI   Scleroderma (Scl-70) (ENA) Antibody, IgG <0.2 0.0 - 0.9 AI   ENA SSA (RO) Ab <0.2 0.0 - 0.9 AI   ENA SSB (LA) Ab <0.2 0.0 - 0.9 AI   Chromatin Ab SerPl-aCnc <0.2 0.0 - 0.9 AI   Anti  JO-1 <0.2 0.0 - 0.9 AI   Centromere Ab Screen <0.2 0.0 - 0.9 AI   See below: Comment   CK  Result Value Ref Range   Total CK 71 32 - 182 U/L  Rocky mtn spotted fvr abs pnl(IgG+IgM)  Result Value Ref Range   RMSF IgG Equivocal Negative   RMSF IgM 0.42 0.00 - 0.89 index  Uric acid  Result Value Ref Range   Uric Acid 5.5 2.6 - 6.2 mg/dL  Ehrlichia antibody panel  Result Value Ref Range   E.Chaffeensis (HME) IgG Negative Neg:<1:64   E. Chaffeensis (HME) IgM Titer Negative Neg:<1:20   HGE IgG Titer Negative Neg:<1:64   HGE IgM Titer Negative Neg:<1:20  Babesia microti Antibody Panel  Result Value Ref Range   Babesia microti IgM <1:10 Neg:<1:10   Babesia microti IgG <1:10 Neg:<1:10  Comprehensive metabolic panel  Result Value Ref Range   Glucose 82 70 - 99 mg/dL   BUN 11 6 - 20 mg/dL   Creatinine, Ser 0.92 0.57 - 1.00 mg/dL   eGFR 86 >59 mL/min/1.73   BUN/Creatinine Ratio 12 9 - 23   Sodium 138 134 - 144 mmol/L   Potassium 4.3 3.5 - 5.2 mmol/L   Chloride 102 96 - 106 mmol/L   CO2 26 20 - 29 mmol/L   Calcium 9.5 8.7 - 10.2 mg/dL   Total Protein 7.4 6.0 - 8.5 g/dL   Albumin 5.1 (H) 3.9 - 5.0 g/dL   Globulin, Total 2.3 1.5 - 4.5 g/dL   Albumin/Globulin Ratio 2.2 1.2 - 2.2   Bilirubin Total 0.3 0.0 - 1.2 mg/dL   Alkaline Phosphatase 81 44 - 121 IU/L   AST 19 0 - 40 IU/L   ALT 21 0 - 32 IU/L  Vitamin D (25 hydroxy)  Result Value Ref Range   Vit D, 25-Hydroxy 30.0 30.0 - 100.0 ng/mL  Anemia Profile B  Result Value Ref Range   Total Iron Binding Capacity 328 250 - 450 ug/dL   UIBC 245 131 - 425 ug/dL   Iron 83 27 - 159 ug/dL   Iron Saturation 25 15 - 55 %  Ferritin 42 15 - 150 ng/mL   Vitamin B-12 311 232 - 1,245 pg/mL   Folate 7.0 >3.0 ng/mL   WBC 5.3 3.4 - 10.8 x10E3/uL   RBC 4.34 3.77 - 5.28 x10E6/uL   Hemoglobin 13.8 11.1 - 15.9 g/dL   Hematocrit 41.1 34.0 - 46.6 %   MCV 95 79 - 97 fL   MCH 31.8 26.6 - 33.0 pg   MCHC 33.6 31.5 - 35.7 g/dL   RDW 12.0 11.7 - 15.4 %    Platelets 255 150 - 450 x10E3/uL   Neutrophils 49 Not Estab. %   Lymphs 41 Not Estab. %   Monocytes 8 Not Estab. %   Eos 1 Not Estab. %   Basos 1 Not Estab. %   Neutrophils Absolute 2.6 1.4 - 7.0 x10E3/uL   Lymphocytes Absolute 2.2 0.7 - 3.1 x10E3/uL   Monocytes Absolute 0.4 0.1 - 0.9 x10E3/uL   EOS (ABSOLUTE) 0.1 0.0 - 0.4 x10E3/uL   Basophils Absolute 0.0 0.0 - 0.2 x10E3/uL   Immature Granulocytes 0 Not Estab. %   Immature Grans (Abs) 0.0 0.0 - 0.1 x10E3/uL   Retic Ct Pct 2.1 0.6 - 2.6 %  FSH/LH  Result Value Ref Range   LH 11.9 mIU/mL   FSH 6.0 mIU/mL  Estradiol  Result Value Ref Range   Estradiol 74.2 pg/mL  RMSF, IgG, IFA  Result Value Ref Range   RMSF, IGG, IFA <1:64 Neg <1:64      Assessment & Plan:   Problem List Items Addressed This Visit       Other   Depression, recurrent ([place]) - Primary    Chronic.  Uncontrolled.  Will start Zoloft 51m daily.  Side effects and benefits of medication discussed during visit. Return to clinic in 2 weeks for reevaluation.  Call sooner if concerns arise.        Relevant Medications   sertraline (ZOLOFT) 25 MG tablet   Anxiety    Chronic.  Uncontrolled.  Will start Zoloft 265mdaily.  Side effects and benefits of medication discussed during visit. Return to clinic in 2 weeks for reevaluation.  Call sooner if concerns arise.       Relevant Medications   sertraline (ZOLOFT) 25 MG tablet   Other Visit Diagnoses     Encounter to discuss test results       Reviewed results from previous visit. Positive for RF. Will repeat in 2 weeks. DIscussed that it could be elevated due to acute stress.         Follow up plan: Return in about 2 weeks (around 11/13/2021) for Depression/Anxiety FU.

## 2021-10-30 NOTE — Assessment & Plan Note (Signed)
Chronic.  Uncontrolled.  Will start Zoloft 25mg  daily.  Side effects and benefits of medication discussed during visit. Return to clinic in 2 weeks for reevaluation.  Call sooner if concerns arise.

## 2021-11-17 ENCOUNTER — Other Ambulatory Visit: Payer: Self-pay

## 2021-11-17 ENCOUNTER — Ambulatory Visit: Payer: 59 | Admitting: Nurse Practitioner

## 2021-11-17 ENCOUNTER — Encounter: Payer: Self-pay | Admitting: Nurse Practitioner

## 2021-11-17 VITALS — BP 112/75 | HR 82 | Temp 98.6°F | Wt 183.0 lb

## 2021-11-17 DIAGNOSIS — F419 Anxiety disorder, unspecified: Secondary | ICD-10-CM | POA: Diagnosis not present

## 2021-11-17 DIAGNOSIS — F339 Major depressive disorder, recurrent, unspecified: Secondary | ICD-10-CM | POA: Diagnosis not present

## 2021-11-17 MED ORDER — SERTRALINE HCL 25 MG PO TABS: 25.0000 mg | ORAL_TABLET | Freq: Every day | ORAL | 1 refills | Status: DC

## 2021-11-17 NOTE — Assessment & Plan Note (Signed)
Chronic.  Controlled.  Continue with current medication regimen of Zoloft 25mg  daily.  Refill sent today.  Labs ordered today.  Return to clinic in 3 months for reevaluation.  Call sooner if concerns arise.  ?

## 2021-11-17 NOTE — Assessment & Plan Note (Signed)
Chronic.  Controlled.  Continue with current medication regimen of Zoloft 25mg daily.  Refill sent today.  Labs ordered today.  Return to clinic in 3 months for reevaluation.  Call sooner if concerns arise.  ?

## 2021-11-17 NOTE — Progress Notes (Signed)
? ?BP 112/75   Pulse 82   Temp 98.6 ?F (37 ?C) (Oral)   Wt 183 lb (83 kg)   LMP 10/28/2021 (Exact Date)   SpO2 98%   BMI 30.75 kg/m?   ? ?Subjective:  ? ? Patient ID: Jean Gay, female    DOB: 1991-04-28, 31 y.o.   MRN: [id number] ? ?HPI: ?Jean Gay is a 31 y.o. female ? ?Chief Complaint  ?Patient presents with  ? Depression  ? ?DEPRESSION/ANXIETY ?Patient feels like the zoloft is working well for her.  She doesn't feel like she is drowning.  The world is easier to manage with the Zoloft.  She also has been taking the Vitamin D which is also helping her.  She feels like this is a good dose for her and would like to continue with this dose.   ? ? ?[facility] Office Visit from 11/17/2021 in [place]  ?PHQ-9 Total Score 4  ? ?  ? ?GAD 7 : Generalized Anxiety Score 11/17/2021 10/30/2021 10/16/2021  ?Nervous, Anxious, on Edge 1 3 2   ?Control/stop worrying 1 3 3   ?Worry too much - different things 1 3 3   ?Trouble relaxing 1 3 2   ?Restless 0 2 2  ?Easily annoyed or irritable 0 3 3  ?Afraid - awful might happen 0 1 3  ?Total GAD 7 Score 4 18 18   ?Anxiety Difficulty Not difficult at all Very difficult Very difficult  ? ? ? ?Relevant past medical, surgical, family and social history reviewed and updated as indicated. Interim medical history since our last visit reviewed. ?Allergies and medications reviewed and updated. ? ?Review of Systems  ?Psychiatric/Behavioral:  Positive for dysphoric mood. Negative for suicidal ideas. The patient is nervous/anxious.   ? ?Per HPI unless specifically indicated above ? ?   ?Objective:  ?  ?BP 112/75   Pulse 82   Temp 98.6 ?F (37 ?C) (Oral)   Wt 183 lb (83 kg)   LMP 10/28/2021 (Exact Date)   SpO2 98%   BMI 30.75 kg/m?   ?Wt Readings from Last 3 Encounters:  ?11/17/21 183 lb (83 kg)  ?10/30/21 186 lb 9.6 oz (84.6 kg)  ?10/16/21 183 lb 3.2 oz (83.1 kg)  ?  ?Physical Exam ?Vitals and nursing note reviewed.  ?Constitutional:   ?   General: She is not in acute  distress. ?   Appearance: Normal appearance. She is normal weight. She is not ill-appearing, toxic-appearing or diaphoretic.  ?HENT:  ?   Head: Normocephalic.  ?   Right Ear: External ear normal.  ?   Left Ear: External ear normal.  ?   Nose: Nose normal.  ?   Mouth/Throat:  ?   Mouth: Mucous membranes are moist.  ?   Pharynx: Oropharynx is clear.  ?Eyes:  ?   General:     ?   Right eye: No discharge.     ?   Left eye: No discharge.  ?   Extraocular Movements: Extraocular movements intact.  ?   Conjunctiva/sclera: Conjunctivae normal.  ?   Pupils: Pupils are equal, round, and reactive to light.  ?Cardiovascular:  ?   Rate and Rhythm: Normal rate and regular rhythm.  ?   Heart sounds: No murmur heard. ?Pulmonary:  ?   Effort: Pulmonary effort is normal. No respiratory distress.  ?   Breath sounds: Normal breath sounds. No wheezing or rales.  ?Musculoskeletal:  ?   Cervical back: Normal range of motion and neck supple.  ?  Skin: ?   General: Skin is warm and dry.  ?   Capillary Refill: Capillary refill takes less than 2 seconds.  ?Neurological:  ?   General: No focal deficit present.  ?   Mental Status: She is alert and oriented to person, place, and time. Mental status is at baseline.  ?Psychiatric:     ?   Mood and Affect: Mood normal.     ?   Behavior: Behavior normal.     ?   Thought Content: Thought content normal.     ?   Judgment: Judgment normal.  ? ? ?Results for orders placed or performed in visit on 10/16/21  ?Rheumatoid factor  ?Result Value Ref Range  ? Rhuematoid fact SerPl-aCnc 72.3 (H) <14.0 IU/mL  ?ANA  ?Result Value Ref Range  ? Anti Nuclear Antibody (ANA) Negative Negative  ?ANA+ENA+DNA/DS+Antich+Centr  ?Result Value Ref Range  ? ANA Titer 1 Negative   ? dsDNA Ab <1 0 - 9 IU/mL  ? ENA RNP Ab <0.2 0.0 - 0.9 AI  ? ENA SM Ab Ser-aCnc <0.2 0.0 - 0.9 AI  ? Scleroderma (Scl-70) (ENA) Antibody, IgG <0.2 0.0 - 0.9 AI  ? ENA SSA (RO) Ab <0.2 0.0 - 0.9 AI  ? ENA SSB (LA) Ab <0.2 0.0 - 0.9 AI  ? Chromatin Ab  SerPl-aCnc <0.2 0.0 - 0.9 AI  ? Anti JO-1 <0.2 0.0 - 0.9 AI  ? Centromere Ab Screen <0.2 0.0 - 0.9 AI  ? See below: Comment   ?CK  ?Result Value Ref Range  ? Total CK 71 32 - 182 U/L  ?Rocky mtn spotted fvr abs pnl(IgG+IgM)  ?Result Value Ref Range  ? RMSF IgG Equivocal Negative  ? RMSF IgM 0.42 0.00 - 0.89 index  ?Uric acid  ?Result Value Ref Range  ? Uric Acid 5.5 2.6 - 6.2 mg/dL  ?Ehrlichia antibody panel  ?Result Value Ref Range  ? E.Chaffeensis (HME) IgG Negative Neg:<1:64  ? E. Chaffeensis (HME) IgM Titer Negative Neg:<1:20  ? HGE IgG Titer Negative Neg:<1:64  ? HGE IgM Titer Negative Neg:<1:20  ?Babesia microti Antibody Panel  ?Result Value Ref Range  ? Babesia microti IgM <1:10 Neg:<1:10  ? Babesia microti IgG <1:10 Neg:<1:10  ?Comprehensive metabolic panel  ?Result Value Ref Range  ? Glucose 82 70 - 99 mg/dL  ? BUN 11 6 - 20 mg/dL  ? Creatinine, Ser 0.92 0.57 - 1.00 mg/dL  ? eGFR 86 >59 mL/min/1.73  ? BUN/Creatinine Ratio 12 9 - 23  ? Sodium 138 134 - 144 mmol/L  ? Potassium 4.3 3.5 - 5.2 mmol/L  ? Chloride 102 96 - 106 mmol/L  ? CO2 26 20 - 29 mmol/L  ? Calcium 9.5 8.7 - 10.2 mg/dL  ? Total Protein 7.4 6.0 - 8.5 g/dL  ? Albumin 5.1 (H) 3.9 - 5.0 g/dL  ? Globulin, Total 2.3 1.5 - 4.5 g/dL  ? Albumin/Globulin Ratio 2.2 1.2 - 2.2  ? Bilirubin Total 0.3 0.0 - 1.2 mg/dL  ? Alkaline Phosphatase 81 44 - 121 IU/L  ? AST 19 0 - 40 IU/L  ? ALT 21 0 - 32 IU/L  ?Vitamin D (25 hydroxy)  ?Result Value Ref Range  ? Vit D, 25-Hydroxy 30.0 30.0 - 100.0 ng/mL  ?Anemia Profile B  ?Result Value Ref Range  ? Total Iron Binding Capacity 328 250 - 450 ug/dL  ? UIBC 245 131 - 425 ug/dL  ? Iron 83 27 - 159 ug/dL  ? Iron Saturation  25 15 - 55 %  ? Ferritin 42 15 - 150 ng/mL  ? Vitamin B-12 311 232 - 1,245 pg/mL  ? Folate 7.0 >3.0 ng/mL  ? WBC 5.3 3.4 - 10.8 x10E3/uL  ? RBC 4.34 3.77 - 5.28 x10E6/uL  ? Hemoglobin 13.8 11.1 - 15.9 g/dL  ? Hematocrit 41.1 34.0 - 46.6 %  ? MCV 95 79 - 97 fL  ? MCH 31.8 26.6 - 33.0 pg  ? MCHC 33.6 31.5  - 35.7 g/dL  ? RDW 12.0 11.7 - 15.4 %  ? Platelets 255 150 - 450 x10E3/uL  ? Neutrophils 49 Not Estab. %  ? Lymphs 41 Not Estab. %  ? Monocytes 8 Not Estab. %  ? Eos 1 Not Estab. %  ? Basos 1 Not Estab. %  ? Neutrophils Absolute 2.6 1.4 - 7.0 x10E3/uL  ? Lymphocytes Absolute 2.2 0.7 - 3.1 x10E3/uL  ? Monocytes Absolute 0.4 0.1 - 0.9 x10E3/uL  ? EOS (ABSOLUTE) 0.1 0.0 - 0.4 x10E3/uL  ? Basophils Absolute 0.0 0.0 - 0.2 x10E3/uL  ? Immature Granulocytes 0 Not Estab. %  ? Immature Grans (Abs) 0.0 0.0 - 0.1 x10E3/uL  ? Retic Ct Pct 2.1 0.6 - 2.6 %  ?FSH/LH  ?Result Value Ref Range  ? LH 11.9 mIU/mL  ? FSH 6.0 mIU/mL  ?Estradiol  ?Result Value Ref Range  ? Estradiol 74.2 pg/mL  ?RMSF, IgG, IFA  ?Result Value Ref Range  ? RMSF, IGG, IFA <1:64 Neg <1:64  ? ?   ?Assessment & Plan:  ? ?Problem List Items Addressed This Visit   ? ?  ? Other  ? Depression, recurrent ([place]) - Primary  ?  Chronic.  Controlled.  Continue with current medication regimen of Zoloft 23m daily.  Refill sent today.  Labs ordered today.  Return to clinic in 3 months for reevaluation.  Call sooner if concerns arise.  ? ?  ?  ? Relevant Medications  ? sertraline (ZOLOFT) 25 MG tablet  ? Anxiety  ?  Chronic.  Controlled.  Continue with current medication regimen of Zoloft 233mdaily.  Refill sent today.  Labs ordered today.  Return to clinic in 3 months for reevaluation.  Call sooner if concerns arise.  ?  ?  ? Relevant Medications  ? sertraline (ZOLOFT) 25 MG tablet  ?  ? ?Follow up plan: ?Return in about 3 months (around 02/17/2022) for Depression/Anxiety FU. ? ? ? ? ? ?

## 2021-12-03 DIAGNOSIS — Z975 Presence of (intrauterine) contraceptive device: Secondary | ICD-10-CM | POA: Insufficient documentation

## 2022-02-19 ENCOUNTER — Ambulatory Visit: Payer: 59 | Admitting: Nurse Practitioner

## 2022-02-19 ENCOUNTER — Encounter: Payer: Self-pay | Admitting: Nurse Practitioner

## 2022-02-19 VITALS — HR 73 | Temp 98.3°F | Wt 180.4 lb

## 2022-02-19 DIAGNOSIS — R768 Other specified abnormal immunological findings in serum: Secondary | ICD-10-CM

## 2022-02-19 DIAGNOSIS — F419 Anxiety disorder, unspecified: Secondary | ICD-10-CM

## 2022-02-19 DIAGNOSIS — F339 Major depressive disorder, recurrent, unspecified: Secondary | ICD-10-CM | POA: Diagnosis not present

## 2022-02-19 MED ORDER — SERTRALINE HCL 50 MG PO TABS
50.0000 mg | ORAL_TABLET | Freq: Every day | ORAL | 1 refills | Status: DC
Start: 2022-02-19 — End: 2022-06-18

## 2022-02-19 NOTE — Progress Notes (Signed)
Pulse 73   Temp 98.3 F (36.8 C) (Oral)   Wt 180 lb 6.4 oz (81.8 kg)   SpO2 98%   Breastfeeding No   BMI 30.31 kg/m    Subjective:    Patient ID: Jean Gay, female    DOB: 1990-10-31, 31 y.o.   MRN: [id number]  HPI: Jean Gay is a 31 y.o. female  Chief Complaint  Patient presents with   Depression    3 month follow up pt states she feels better since last office visit, tolerating medication well.    Anxiety   DEPRESSION/ANXIETY Patient feels like the zoloft is working well for her.  She feels like she is stressed.  She feels like increasing the dose may help with the stress.  Denies SI.      [place] Office Visit from 02/19/2022 in [place]  PHQ-9 Total Score 11         02/19/2022    4:05 PM 11/17/2021    1:13 PM 10/30/2021    3:09 PM 10/16/2021    9:48 AM  GAD 7 : Generalized Anxiety Score  Nervous, Anxious, on Edge 1 1 3 2   Control/stop worrying 3 1 3 3   Worry too much - different things 2 1 3 3   Trouble relaxing 2 1 3 2   Restless 2 0 2 2  Easily annoyed or irritable 2 0 3 3  Afraid - awful might happen 1 0 1 3  Total GAD 7 Score 13 4 18 18   Anxiety Difficulty Somewhat difficult Not difficult at all Very difficult Very difficult     Relevant past medical, surgical, family and social history reviewed and updated as indicated. Interim medical history since our last visit reviewed. Allergies and medications reviewed and updated.  Review of Systems  Psychiatric/Behavioral:  Positive for dysphoric mood. Negative for suicidal ideas. The patient is nervous/anxious.    Per HPI unless specifically indicated above     Objective:    Pulse 73   Temp 98.3 F (36.8 C) (Oral)   Wt 180 lb 6.4 oz (81.8 kg)   SpO2 98%   Breastfeeding No   BMI 30.31 kg/m   Wt Readings from Last 3 Encounters:  02/19/22 180 lb 6.4 oz (81.8 kg)  11/17/21 183 lb (83 kg)  10/30/21 186 lb 9.6 oz (84.6 kg)    Physical Exam Vitals and nursing note reviewed.   Constitutional:      General: She is not in acute distress.    Appearance: Normal appearance. She is normal weight. She is not ill-appearing, toxic-appearing or diaphoretic.  HENT:     Head: Normocephalic.     Right Ear: External ear normal.     Left Ear: External ear normal.     Nose: Nose normal.     Mouth/Throat:     Mouth: Mucous membranes are moist.     Pharynx: Oropharynx is clear.  Eyes:     General:        Right eye: No discharge.        Left eye: No discharge.     Extraocular Movements: Extraocular movements intact.     Conjunctiva/sclera: Conjunctivae normal.     Pupils: Pupils are equal, round, and reactive to light.  Cardiovascular:     Rate and Rhythm: Normal rate and regular rhythm.     Heart sounds: No murmur heard. Pulmonary:     Effort: Pulmonary effort is normal. No respiratory distress.     Breath sounds: Normal  breath sounds. No wheezing or rales.  Musculoskeletal:     Cervical back: Normal range of motion and neck supple.  Skin:    General: Skin is warm and dry.     Capillary Refill: Capillary refill takes less than 2 seconds.  Neurological:     General: No focal deficit present.     Mental Status: She is alert and oriented to person, place, and time. Mental status is at baseline.  Psychiatric:        Mood and Affect: Mood normal.        Behavior: Behavior normal.        Thought Content: Thought content normal.        Judgment: Judgment normal.    Results for orders placed or performed in visit on 10/16/21  Rheumatoid factor  Result Value Ref Range   Rhuematoid fact SerPl-aCnc 72.3 (H) <14.0 IU/mL  ANA  Result Value Ref Range   Anti Nuclear Antibody (ANA) Negative Negative  ANA+ENA+DNA/DS+Antich+Centr  Result Value Ref Range   ANA Titer 1 Negative    dsDNA Ab <1 0 - 9 IU/mL   ENA RNP Ab <0.2 0.0 - 0.9 AI   ENA SM Ab Ser-aCnc <0.2 0.0 - 0.9 AI   Scleroderma (Scl-70) (ENA) Antibody, IgG <0.2 0.0 - 0.9 AI   ENA SSA (RO) Ab <0.2 0.0 - 0.9 AI    ENA SSB (LA) Ab <0.2 0.0 - 0.9 AI   Chromatin Ab SerPl-aCnc <0.2 0.0 - 0.9 AI   Anti JO-1 <0.2 0.0 - 0.9 AI   Centromere Ab Screen <0.2 0.0 - 0.9 AI   See below: Comment   CK  Result Value Ref Range   Total CK 71 32 - 182 U/L  Rocky mtn spotted fvr abs pnl(IgG+IgM)  Result Value Ref Range   RMSF IgG Equivocal Negative   RMSF IgM 0.42 0.00 - 0.89 index  Uric acid  Result Value Ref Range   Uric Acid 5.5 2.6 - 6.2 mg/dL  Ehrlichia antibody panel  Result Value Ref Range   E.Chaffeensis (HME) IgG Negative Neg:<1:64   E. Chaffeensis (HME) IgM Titer Negative Neg:<1:20   HGE IgG Titer Negative Neg:<1:64   HGE IgM Titer Negative Neg:<1:20  Babesia microti Antibody Panel  Result Value Ref Range   Babesia microti IgM <1:10 Neg:<1:10   Babesia microti IgG <1:10 Neg:<1:10  Comprehensive metabolic panel  Result Value Ref Range   Glucose 82 70 - 99 mg/dL   BUN 11 6 - 20 mg/dL   Creatinine, Ser 0.92 0.57 - 1.00 mg/dL   eGFR 86 >59 mL/min/1.73   BUN/Creatinine Ratio 12 9 - 23   Sodium 138 134 - 144 mmol/L   Potassium 4.3 3.5 - 5.2 mmol/L   Chloride 102 96 - 106 mmol/L   CO2 26 20 - 29 mmol/L   Calcium 9.5 8.7 - 10.2 mg/dL   Total Protein 7.4 6.0 - 8.5 g/dL   Albumin 5.1 (H) 3.9 - 5.0 g/dL   Globulin, Total 2.3 1.5 - 4.5 g/dL   Albumin/Globulin Ratio 2.2 1.2 - 2.2   Bilirubin Total 0.3 0.0 - 1.2 mg/dL   Alkaline Phosphatase 81 44 - 121 IU/L   AST 19 0 - 40 IU/L   ALT 21 0 - 32 IU/L  Vitamin D (25 hydroxy)  Result Value Ref Range   Vit D, 25-Hydroxy 30.0 30.0 - 100.0 ng/mL  Anemia Profile B  Result Value Ref Range   Total Iron Binding Capacity 328 250 -  450 ug/dL   UIBC 245 131 - 425 ug/dL   Iron 83 27 - 159 ug/dL   Iron Saturation 25 15 - 55 %   Ferritin 42 15 - 150 ng/mL   Vitamin B-12 311 232 - 1,245 pg/mL   Folate 7.0 >3.0 ng/mL   WBC 5.3 3.4 - 10.8 x10E3/uL   RBC 4.34 3.77 - 5.28 x10E6/uL   Hemoglobin 13.8 11.1 - 15.9 g/dL   Hematocrit 41.1 34.0 - 46.6 %   MCV 95 79 -  97 fL   MCH 31.8 26.6 - 33.0 pg   MCHC 33.6 31.5 - 35.7 g/dL   RDW 12.0 11.7 - 15.4 %   Platelets 255 150 - 450 x10E3/uL   Neutrophils 49 Not Estab. %   Lymphs 41 Not Estab. %   Monocytes 8 Not Estab. %   Eos 1 Not Estab. %   Basos 1 Not Estab. %   Neutrophils Absolute 2.6 1.4 - 7.0 x10E3/uL   Lymphocytes Absolute 2.2 0.7 - 3.1 x10E3/uL   Monocytes Absolute 0.4 0.1 - 0.9 x10E3/uL   EOS (ABSOLUTE) 0.1 0.0 - 0.4 x10E3/uL   Basophils Absolute 0.0 0.0 - 0.2 x10E3/uL   Immature Granulocytes 0 Not Estab. %   Immature Grans (Abs) 0.0 0.0 - 0.1 x10E3/uL   Retic Ct Pct 2.1 0.6 - 2.6 %  FSH/LH  Result Value Ref Range   LH 11.9 mIU/mL   FSH 6.0 mIU/mL  Estradiol  Result Value Ref Range   Estradiol 74.2 pg/mL  RMSF, IgG, IFA  Result Value Ref Range   RMSF, IGG, IFA <1:64 Neg <1:64      Assessment & Plan:   Problem List Items Addressed This Visit       Other   Depression, recurrent ([place]) - Primary    Chronic. Feels like it could be better.  Will increase Zoloft to 55m daily.  Will follow up in 1 month for reevaluation.  Call sooner if concerns arise.        Relevant Medications   sertraline (ZOLOFT) 50 MG tablet   Anxiety    Chronic. Feels like it could be better.  Will increase Zoloft to 54mdaily.  Will follow up in 1 month for reevaluation.  Call sooner if concerns arise.        Relevant Medications   sertraline (ZOLOFT) 50 MG tablet   Other Visit Diagnoses     Elevated rheumatoid factor       Elevated at last visit.  Will recheck labs at visit today.  Will make recommendations based on lab results.    Relevant Orders   Rheumatoid factor   ANA   ANA+ENA+DNA/DS+Antich+Centr   CK   Rocky mtn spotted fvr abs pnl(IgG+IgM)   Uric acid   Ehrlichia antibody panel   Babesia microti Antibody Panel   Comprehensive metabolic panel   CBC with Differential/Platelet        Follow up plan: Return in about 1 month (around 03/21/2022) for Depression/Anxiety FU  (virtual).

## 2022-02-19 NOTE — Assessment & Plan Note (Signed)
Chronic. Feels like it could be better.  Will increase Zoloft to 50mg daily.  Will follow up in 1 month for reevaluation.  Call sooner if concerns arise.  

## 2022-02-19 NOTE — Assessment & Plan Note (Signed)
Chronic. Feels like it could be better.  Will increase Zoloft to 50mg  daily.  Will follow up in 1 month for reevaluation.  Call sooner if concerns arise.

## 2022-02-20 ENCOUNTER — Encounter: Payer: Self-pay | Admitting: Nurse Practitioner

## 2022-02-20 DIAGNOSIS — R768 Other specified abnormal immunological findings in serum: Secondary | ICD-10-CM

## 2022-02-20 NOTE — Progress Notes (Signed)
Hi Jean Gay.  Your Rheumatoid factor is improved but still elevated.  Would you be okay with seeing Rheumatology?

## 2022-02-22 ENCOUNTER — Telehealth: Payer: Self-pay

## 2022-02-22 NOTE — Telephone Encounter (Signed)
Copied from [place]. Topic: Referral - Question >> Feb 22, 2022  1:27 PM Rudene Anda wrote: Reason for CRM: [facility] called in stating the referral sent over will need to go to a different facility, caller stated they do not accept the pts insurance.   Routing to referral coordinator to re-direct referral.

## 2022-03-01 LAB — COMPREHENSIVE METABOLIC PANEL
ALT: 26 IU/L (ref 0–32)
AST: 22 IU/L (ref 0–40)
Albumin/Globulin Ratio: 2 (ref 1.2–2.2)
Albumin: 5.3 g/dL — ABNORMAL HIGH (ref 3.9–5.0)
Alkaline Phosphatase: 87 IU/L (ref 44–121)
BUN/Creatinine Ratio: 12 (ref 9–23)
BUN: 12 mg/dL (ref 6–20)
Bilirubin Total: 0.4 mg/dL (ref 0.0–1.2)
CO2: 22 mmol/L (ref 20–29)
Calcium: 9.8 mg/dL (ref 8.7–10.2)
Chloride: 100 mmol/L (ref 96–106)
Creatinine, Ser: 1 mg/dL (ref 0.57–1.00)
Globulin, Total: 2.7 g/dL (ref 1.5–4.5)
Glucose: 94 mg/dL (ref 70–99)
Potassium: 3.9 mmol/L (ref 3.5–5.2)
Sodium: 140 mmol/L (ref 134–144)
Total Protein: 8 g/dL (ref 6.0–8.5)
eGFR: 78 mL/min/{1.73_m2} (ref >59)

## 2022-03-01 LAB — ANA+ENA+DNA/DS+ANTICH+CENTR
ANA Titer 1: NEGATIVE
Anti JO-1: <0.2 AI (ref 0.0–0.9)
Centromere Ab Screen: <0.2 AI (ref 0.0–0.9)
Chromatin Ab SerPl-aCnc: <0.2 AI (ref 0.0–0.9)
ENA RNP Ab: <0.2 AI (ref 0.0–0.9)
ENA SM Ab Ser-aCnc: <0.2 AI (ref 0.0–0.9)
ENA SSA (RO) Ab: <0.2 AI (ref 0.0–0.9)
ENA SSB (LA) Ab: <0.2 AI (ref 0.0–0.9)
Scleroderma (Scl-70) (ENA) Antibody, IgG: <0.2 AI (ref 0.0–0.9)
dsDNA Ab: <1 IU/mL (ref 0–9)

## 2022-03-01 LAB — EHRLICHIA ANTIBODY PANEL
E. Chaffeensis (HME) IgM Titer: NEGATIVE
E.Chaffeensis (HME) IgG: NEGATIVE
HGE IgG Titer: NEGATIVE
HGE IgM Titer: NEGATIVE

## 2022-03-01 LAB — CBC WITH DIFFERENTIAL/PLATELET
Basophils Absolute: 0 10*3/uL (ref 0.0–0.2)
Basos: 0 %
EOS (ABSOLUTE): 0.1 10*3/uL (ref 0.0–0.4)
Eos: 1 %
Hematocrit: 39.9 % (ref 34.0–46.6)
Hemoglobin: 14 g/dL (ref 11.1–15.9)
Immature Grans (Abs): 0 10*3/uL (ref 0.0–0.1)
Immature Granulocytes: 0 %
Lymphocytes Absolute: 3.4 10*3/uL — ABNORMAL HIGH (ref 0.7–3.1)
Lymphs: 45 %
MCH: 32.3 pg (ref 26.6–33.0)
MCHC: 35.1 g/dL (ref 31.5–35.7)
MCV: 92 fL (ref 79–97)
Monocytes Absolute: 0.4 10*3/uL (ref 0.1–0.9)
Monocytes: 6 %
Neutrophils Absolute: 3.7 10*3/uL (ref 1.4–7.0)
Neutrophils: 48 %
Platelets: 260 10*3/uL (ref 150–450)
RBC: 4.33 x10E6/uL (ref 3.77–5.28)
RDW: 12.6 % (ref 11.7–15.4)
WBC: 7.7 10*3/uL (ref 3.4–10.8)

## 2022-03-01 LAB — CK: Total CK: 61 U/L (ref 32–182)

## 2022-03-01 LAB — ROCKY MTN SPOTTED FVR ABS PNL(IGG+IGM)
RMSF IgG: POSITIVE — AB
RMSF IgM: 0.59 index (ref 0.00–0.89)

## 2022-03-01 LAB — URIC ACID: Uric Acid: 5.4 mg/dL (ref 2.6–6.2)

## 2022-03-01 LAB — ANA: Anti Nuclear Antibody (ANA): NEGATIVE

## 2022-03-01 LAB — RHEUMATOID FACTOR: Rheumatoid fact SerPl-aCnc: 52 IU/mL — ABNORMAL HIGH (ref <14.0)

## 2022-03-01 LAB — RMSF, IGG, IFA: RMSF, IGG, IFA: <1:64 {titer}

## 2022-03-01 LAB — BABESIA MICROTI ANTIBODY PANEL
Babesia microti IgG: <1:10 {titer}
Babesia microti IgM: <1:10 {titer}

## 2022-03-21 ENCOUNTER — Telehealth: Payer: 59 | Admitting: Nurse Practitioner

## 2022-03-21 ENCOUNTER — Encounter: Payer: Self-pay | Admitting: Nurse Practitioner

## 2022-03-21 DIAGNOSIS — F339 Major depressive disorder, recurrent, unspecified: Secondary | ICD-10-CM

## 2022-03-21 NOTE — Progress Notes (Signed)
There were no vitals taken for this visit.   Subjective:    Patient ID: Jean Gay, female    DOB: July 21, 1991, 31 y.o.   MRN: [id number]  HPI: Jean Gay is a 31 y.o. female  Chief Complaint  Patient presents with   Depression   Anxiety    1 month follow up    DEPRESSION/ANXIETY Patient feels like the zoloft is working pretty well.  She overall feels a lot less on edge.  She feels like bumping it up was a good decision.  She does feel like it has a positive impact on her life.  Denies concerns at visit today.     Flowsheet Row Video Visit from 03/21/2022 in [facility]  PHQ-9 Total Score 4         03/21/2022    4:08 PM 02/19/2022    4:05 PM 11/17/2021    1:13 PM 10/30/2021    3:09 PM  GAD 7 : Generalized Anxiety Score  Nervous, Anxious, on Edge 0 1 1 3   Control/stop worrying 1 3 1 3   Worry too much - different things 1 2 1 3   Trouble relaxing 1 2 1 3   Restless 3 2 0 2  Easily annoyed or irritable 0 2 0 3  Afraid - awful might happen  1 0 1  Total GAD 7 Score  13 4 18   Anxiety Difficulty Not difficult at all Somewhat difficult Not difficult at all Very difficult     Relevant past medical, surgical, family and social history reviewed and updated as indicated. Interim medical history since our last visit reviewed. Allergies and medications reviewed and updated.  Review of Systems  Psychiatric/Behavioral:  Positive for dysphoric mood. Negative for suicidal ideas. The patient is nervous/anxious.     Per HPI unless specifically indicated above     Objective:    There were no vitals taken for this visit.  Wt Readings from Last 3 Encounters:  02/19/22 180 lb 6.4 oz (81.8 kg)  11/17/21 183 lb (83 kg)  10/30/21 186 lb 9.6 oz (84.6 kg)    Physical Exam Vitals and nursing note reviewed.  HENT:     Head: Normocephalic.     Right Ear: Hearing normal.     Left Ear: Hearing normal.     Nose: Nose normal.  Eyes:     Pupils: Pupils are equal, round, and  reactive to light.  Pulmonary:     Effort: Pulmonary effort is normal. No respiratory distress.  Neurological:     Mental Status: She is alert.  Psychiatric:        Mood and Affect: Mood normal.        Behavior: Behavior normal.        Thought Content: Thought content normal.        Judgment: Judgment normal.     Results for orders placed or performed in visit on 02/19/22  Rheumatoid factor  Result Value Ref Range   Rhuematoid fact SerPl-aCnc 52.0 (H) <14.0 IU/mL  ANA  Result Value Ref Range   Anti Nuclear Antibody (ANA) Negative Negative  ANA+ENA+DNA/DS+Antich+Centr  Result Value Ref Range   ANA Titer 1 Negative    dsDNA Ab <1 0 - 9 IU/mL   ENA RNP Ab <0.2 0.0 - 0.9 AI   ENA SM Ab Ser-aCnc <0.2 0.0 - 0.9 AI   Scleroderma (Scl-70) (ENA) Antibody, IgG <0.2 0.0 - 0.9 AI   ENA SSA (RO) Ab <0.2 0.0 - 0.9 AI  ENA SSB (LA) Ab <0.2 0.0 - 0.9 AI   Chromatin Ab SerPl-aCnc <0.2 0.0 - 0.9 AI   Anti JO-1 <0.2 0.0 - 0.9 AI   Centromere Ab Screen <0.2 0.0 - 0.9 AI   See below: Comment   CK  Result Value Ref Range   Total CK 61 32 - 182 U/L  Rocky mtn spotted fvr abs pnl(IgG+IgM)  Result Value Ref Range   RMSF IgG Positive (A) Negative   RMSF IgM 0.59 0.00 - 0.89 index  Uric acid  Result Value Ref Range   Uric Acid 5.4 2.6 - 6.2 mg/dL  Ehrlichia antibody panel  Result Value Ref Range   E.Chaffeensis (HME) IgG Negative Neg:<1:64   E. Chaffeensis (HME) IgM Titer Negative Neg:<1:20   HGE IgG Titer Negative Neg:<1:64   HGE IgM Titer Negative Neg:<1:20  Babesia microti Antibody Panel  Result Value Ref Range   Babesia microti IgG <1:10 Neg:<1:10   Babesia microti IgM <1:10 Neg:<1:10  Comprehensive metabolic panel  Result Value Ref Range   Glucose 94 70 - 99 mg/dL   BUN 12 6 - 20 mg/dL   Creatinine, Ser 1.00 0.57 - 1.00 mg/dL   eGFR 78 >59 mL/min/1.73   BUN/Creatinine Ratio 12 9 - 23   Sodium 140 134 - 144 mmol/L   Potassium 3.9 3.5 - 5.2 mmol/L   Chloride 100 96 - 106 mmol/L    CO2 22 20 - 29 mmol/L   Calcium 9.8 8.7 - 10.2 mg/dL   Total Protein 8.0 6.0 - 8.5 g/dL   Albumin 5.3 (H) 3.9 - 5.0 g/dL   Globulin, Total 2.7 1.5 - 4.5 g/dL   Albumin/Globulin Ratio 2.0 1.2 - 2.2   Bilirubin Total 0.4 0.0 - 1.2 mg/dL   Alkaline Phosphatase 87 44 - 121 IU/L   AST 22 0 - 40 IU/L   ALT 26 0 - 32 IU/L  CBC with Differential/Platelet  Result Value Ref Range   WBC 7.7 3.4 - 10.8 x10E3/uL   RBC 4.33 3.77 - 5.28 x10E6/uL   Hemoglobin 14.0 11.1 - 15.9 g/dL   Hematocrit 39.9 34.0 - 46.6 %   MCV 92 79 - 97 fL   MCH 32.3 26.6 - 33.0 pg   MCHC 35.1 31.5 - 35.7 g/dL   RDW 12.6 11.7 - 15.4 %   Platelets 260 150 - 450 x10E3/uL   Neutrophils 48 Not Estab. %   Lymphs 45 Not Estab. %   Monocytes 6 Not Estab. %   Eos 1 Not Estab. %   Basos 0 Not Estab. %   Neutrophils Absolute 3.7 1.4 - 7.0 x10E3/uL   Lymphocytes Absolute 3.4 (H) 0.7 - 3.1 x10E3/uL   Monocytes Absolute 0.4 0.1 - 0.9 x10E3/uL   EOS (ABSOLUTE) 0.1 0.0 - 0.4 x10E3/uL   Basophils Absolute 0.0 0.0 - 0.2 x10E3/uL   Immature Granulocytes 0 Not Estab. %   Immature Grans (Abs) 0.0 0.0 - 0.1 x10E3/uL  RMSF, IgG, IFA  Result Value Ref Range   RMSF, IGG, IFA <1:64 Neg <1:64      Assessment & Plan:   Problem List Items Addressed This Visit       Other   Depression, recurrent ([place]) - Primary    Chronic.  Controlled.  Continue with current medication regimen of Zoloft 60m daily.  Feels like this is a good dose for her.  Return to clinic in 3 months for reevaluation.  Call sooner if concerns arise.  Follow up plan: Return in about 3 months (around 06/21/2022) for Depression/Anxiety FU.  This visit was completed via MyChart due to the restrictions of the COVID-19 pandemic. All issues as above were discussed and addressed. Physical exam was done as above through visual confirmation on MyChart. If it was felt that the patient should be evaluated in the office, they were directed there. The patient verbally  consented to this visit. Location of the patient: Home Location of the provider: Office Those involved with this call:  Provider: Jon Billings, NP CMA: Valinda Hoar, [place] Desk/Registration: Lynnell Catalan This encounter was conducted via video.  I spent 20 dedicated to the care of this patient on the date of this encounter to include previsit review of symptoms, medications, and plan of care moving forward, face to face time with the patient, and post visit ordering of testing.

## 2022-03-21 NOTE — Progress Notes (Signed)
Patient scheduled.

## 2022-03-21 NOTE — Assessment & Plan Note (Signed)
Chronic.  Controlled.  Continue with current medication regimen of Zoloft 50mg  daily.  Feels like this is a good dose for her.  Return to clinic in 3 months for reevaluation.  Call sooner if concerns arise.

## 2022-06-15 ENCOUNTER — Encounter: Payer: Self-pay | Admitting: Nurse Practitioner

## 2022-06-18 ENCOUNTER — Ambulatory Visit: Payer: 59 | Admitting: Nurse Practitioner

## 2022-06-18 ENCOUNTER — Encounter: Payer: Self-pay | Admitting: Nurse Practitioner

## 2022-06-18 VITALS — BP 114/83 | HR 73 | Temp 98.6°F | Wt 187.3 lb

## 2022-06-18 DIAGNOSIS — F339 Major depressive disorder, recurrent, unspecified: Secondary | ICD-10-CM

## 2022-06-18 DIAGNOSIS — F419 Anxiety disorder, unspecified: Secondary | ICD-10-CM

## 2022-06-18 MED ORDER — BUPROPION HCL ER (XL) 150 MG PO TB24
150.0000 mg | ORAL_TABLET | Freq: Every day | ORAL | 0 refills | Status: DC
Start: 2022-06-18 — End: 2022-07-19

## 2022-06-18 NOTE — Progress Notes (Signed)
BP 114/83   Pulse 73   Temp 98.6 F (37 C) (Oral)   Wt 187 lb 4.8 oz (85 kg)   Breastfeeding No   BMI 31.47 kg/m    Subjective:    Patient ID: Jean Gay, female    DOB: [birth year], 31 y.o.   MRN: [id number]  HPI: Jean Gay is a 31 y.o. female  Chief Complaint  Patient presents with   Medication Management    3 month follow up . Patient would like to discuss low libido today.    Patient states her depression and anxiety have improved but states her libido is still decreased.  She struggled with this prior to getting her anxiety and depression under control.  Hoping that it would improve once her anxiety and depression improve but it hasn't.  She stopped Zoloft for 2 weeks and noticed her mood worsening however, her libido did improve some.    Relevant past medical, surgical, family and social history reviewed and updated as indicated. Interim medical history since our last visit reviewed. Allergies and medications reviewed and updated.  Review of Systems  Endocrine:       Low libido  Psychiatric/Behavioral:  Positive for dysphoric mood. Negative for suicidal ideas. The patient is nervous/anxious.     Per HPI unless specifically indicated above     Objective:    BP 114/83   Pulse 73   Temp 98.6 F (37 C) (Oral)   Wt 187 lb 4.8 oz (85 kg)   Breastfeeding No   BMI 31.47 kg/m   Wt Readings from Last 3 Encounters:  06/18/22 187 lb 4.8 oz (85 kg)  02/19/22 180 lb 6.4 oz (81.8 kg)  11/17/21 183 lb (83 kg)    Physical Exam Vitals and nursing note reviewed.  Constitutional:      General: She is not in acute distress.    Appearance: Normal appearance. She is normal weight. She is not ill-appearing, toxic-appearing or diaphoretic.  HENT:     Head: Normocephalic.     Right Ear: External ear normal.     Left Ear: External ear normal.     Nose: Nose normal.     Mouth/Throat:     Mouth: Mucous membranes are moist.     Pharynx: Oropharynx is clear.  Eyes:      General:        Right eye: No discharge.        Left eye: No discharge.     Extraocular Movements: Extraocular movements intact.     Conjunctiva/sclera: Conjunctivae normal.     Pupils: Pupils are equal, round, and reactive to light.  Cardiovascular:     Rate and Rhythm: Normal rate and regular rhythm.     Heart sounds: No murmur heard. Pulmonary:     Effort: Pulmonary effort is normal. No respiratory distress.     Breath sounds: Normal breath sounds. No wheezing or rales.  Musculoskeletal:     Cervical back: Normal range of motion and neck supple.  Skin:    General: Skin is warm and dry.     Capillary Refill: Capillary refill takes less than 2 seconds.  Neurological:     General: No focal deficit present.     Mental Status: She is alert and oriented to person, place, and time. Mental status is at baseline.  Psychiatric:        Mood and Affect: Mood normal.        Behavior: Behavior normal.  Thought Content: Thought content normal.        Judgment: Judgment normal.     Results for orders placed or performed in visit on 06/18/22  TSH  Result Value Ref Range   TSH 1.700 0.450 - 4.500 uIU/mL  T4, free  Result Value Ref Range   Free T4 0.98 0.82 - 1.77 ng/dL      Assessment & Plan:   Problem List Items Addressed This Visit       Other   Depression, recurrent ([facility])    Chronic.  Controlled.  However, given low libido will change from Zoloft to Wellbutrin to see if symptoms improve.  Side effects and benefits of medication discussed during visit.  Return to clinic in 1 month for reevaluation.  Call sooner if concerns arise.        Relevant Medications   buPROPion (WELLBUTRIN XL) 150 MG 24 hr tablet   Anxiety - Primary    Chronic.  Controlled.  However, given low libido will change from Zoloft to Wellbutrin to see if symptoms improve.  Side effects and benefits of medication discussed during visit.  Return to clinic in 1 month for reevaluation.  Call sooner if  concerns arise.        Relevant Medications   buPROPion (WELLBUTRIN XL) 150 MG 24 hr tablet   Other Relevant Orders   TSH (Completed)   T4, free (Completed)     Follow up plan: Return in about 1 month (around 07/19/2022) for Depression/Anxiety FU.

## 2022-06-19 LAB — T4, FREE: Free T4: 0.98 ng/dL (ref 0.82–1.77)

## 2022-06-19 LAB — TSH: TSH: 1.7 u[IU]/mL (ref 0.450–4.500)

## 2022-06-19 NOTE — Assessment & Plan Note (Signed)
Chronic.  Controlled.  However, given low libido will change from Zoloft to Wellbutrin to see if symptoms improve.  Side effects and benefits of medication discussed during visit.  Return to clinic in 1 month for reevaluation.  Call sooner if concerns arise.

## 2022-06-19 NOTE — Progress Notes (Signed)
Hi Jean Gay.  Your thyroid labs are normal.  No concerns at this time.

## 2022-06-19 NOTE — Assessment & Plan Note (Signed)
Chronic.  Controlled.  However, given low libido will change from Zoloft to Wellbutrin to see if symptoms improve.  Side effects and benefits of medication discussed during visit.  Return to clinic in 1 month for reevaluation.  Call sooner if concerns arise.   

## 2022-06-21 ENCOUNTER — Ambulatory Visit: Payer: 59 | Admitting: Nurse Practitioner

## 2022-07-11 ENCOUNTER — Other Ambulatory Visit: Payer: Self-pay | Admitting: Nurse Practitioner

## 2022-07-12 NOTE — Telephone Encounter (Signed)
Requested medication (s) are due for refill today:   Yes  Requested medication (s) are on the active medication list:   Yes  Future visit scheduled:   Yes   Last ordered: 10/22023 #30, 0 refills  Returned because a 90 day supply is being requested   Requested Prescriptions  Pending Prescriptions Disp Refills   buPROPion (WELLBUTRIN XL) 150 MG 24 hr tablet [Pharmacy Med Name: BUPROPION HCL XL 150 MG TABLET] 90 tablet 1    Sig: TAKE 1 [place]     Psychiatry: Antidepressants - bupropion Passed - 07/11/2022  8:30 AM      Passed - Cr in normal range and within 360 days    Creatinine, Ser  Date Value Ref Range Status  02/19/2022 1.00 0.57 - 1.00 mg/dL Final   Creatinine, Urine  Date Value Ref Range Status  08/29/2020 74 mg/dL Final         Passed - AST in normal range and within 360 days    AST  Date Value Ref Range Status  02/19/2022 22 0 - 40 IU/L Final         Passed - ALT in normal range and within 360 days    ALT  Date Value Ref Range Status  02/19/2022 26 0 - 32 IU/L Final         Passed - Completed PHQ-2 or PHQ-9 in the last 360 days      Passed - Last BP in normal range    BP Readings from Last 1 Encounters:  06/18/22 114/83         Passed - Valid encounter within last 6 months    Recent Outpatient Visits           3 weeks ago [place], NP   3 months ago Depression, recurrent ([place])   [facility] Jon Billings, NP   4 months ago Depression, recurrent ([place])   [facility] Jon Billings, NP   7 months ago Depression, recurrent ([place])   [facility] Jon Billings, NP   8 months ago Depression, recurrent ([place])   [place], Karen, NP       Future Appointments             In 1 week Jon Billings, NP [facility] [place]   In 2 months Bo Merino, MD [facility] [place]. [place]

## 2022-07-19 ENCOUNTER — Encounter: Payer: Self-pay | Admitting: Nurse Practitioner

## 2022-07-19 ENCOUNTER — Ambulatory Visit: Payer: 59 | Admitting: Nurse Practitioner

## 2022-07-19 VITALS — BP 113/79 | HR 81 | Temp 98.2°F | Wt 187.8 lb

## 2022-07-19 DIAGNOSIS — F339 Major depressive disorder, recurrent, unspecified: Secondary | ICD-10-CM | POA: Diagnosis not present

## 2022-07-19 DIAGNOSIS — F419 Anxiety disorder, unspecified: Secondary | ICD-10-CM | POA: Diagnosis not present

## 2022-07-19 DIAGNOSIS — G479 Sleep disorder, unspecified: Secondary | ICD-10-CM

## 2022-07-19 MED ORDER — BUPROPION HCL ER (XL) 150 MG PO TB24: 150.0000 mg | ORAL_TABLET | Freq: Every day | ORAL | 1 refills | Status: DC

## 2022-07-19 MED ORDER — TRAZODONE HCL 50 MG PO TABS: 25.0000 mg | ORAL_TABLET | Freq: Every evening | ORAL | 1 refills | Status: DC | PRN

## 2022-07-19 NOTE — Assessment & Plan Note (Signed)
Chronic.  Controlled.  Continue with current medication regimen of Wellbutrin 150mg  XL.  Refill sent today.  Return to clinic in 3 months for reevaluation.  Call sooner if concerns arise.

## 2022-07-19 NOTE — Assessment & Plan Note (Signed)
Chronic.  Controlled.  Continue with current medication regimen of Wellbutrin 150mg XL.  Refill sent today.  Return to clinic in 3 months for reevaluation.  Call sooner if concerns arise.  

## 2022-07-19 NOTE — Progress Notes (Signed)
BP 113/79   Pulse 81   Temp 98.2 F (36.8 C) (Oral)   Wt 187 lb 12.8 oz (85.2 kg)   SpO2 99%   Breastfeeding No   BMI 31.56 kg/m    Subjective:    Patient ID: Jean Gay, female    DOB: October 20, 1990, 31 y.o.   MRN: [id number]  HPI: Jean Gay is a 31 y.o. female  Chief Complaint  Patient presents with   Anxiety   Depression    1 month follow up, patient reports she is doing well with her medication, she states she is having difficulty sleeping, she is doing OTC 5 mg Melatonin but she is having to take 13 gummies a night to make sure she is able to sleep.    Patient states her depression and anxiety have improved on the Wellbutrin.  She states she really likes the medication.  However, she is having a hard time falling asleep.  She is using Melatonin and it isn't helping her fall asleep or stay asleep.  She has also tried tylneol Pm which didn't help her sleep.  Relevant past medical, surgical, family and social history reviewed and updated as indicated. Interim medical history since our last visit reviewed. Allergies and medications reviewed and updated.  Review of Systems  Endocrine:       Low libido  Psychiatric/Behavioral:  Positive for dysphoric mood and sleep disturbance. Negative for suicidal ideas. The patient is nervous/anxious.     Per HPI unless specifically indicated above     Objective:    BP 113/79   Pulse 81   Temp 98.2 F (36.8 C) (Oral)   Wt 187 lb 12.8 oz (85.2 kg)   SpO2 99%   Breastfeeding No   BMI 31.56 kg/m   Wt Readings from Last 3 Encounters:  07/19/22 187 lb 12.8 oz (85.2 kg)  06/18/22 187 lb 4.8 oz (85 kg)  02/19/22 180 lb 6.4 oz (81.8 kg)    Physical Exam Vitals and nursing note reviewed.  Constitutional:      General: She is not in acute distress.    Appearance: Normal appearance. She is normal weight. She is not ill-appearing, toxic-appearing or diaphoretic.  HENT:     Head: Normocephalic.     Right Ear: External ear  normal.     Left Ear: External ear normal.     Nose: Nose normal.     Mouth/Throat:     Mouth: Mucous membranes are moist.     Pharynx: Oropharynx is clear.  Eyes:     General:        Right eye: No discharge.        Left eye: No discharge.     Extraocular Movements: Extraocular movements intact.     Conjunctiva/sclera: Conjunctivae normal.     Pupils: Pupils are equal, round, and reactive to light.  Cardiovascular:     Rate and Rhythm: Normal rate and regular rhythm.     Heart sounds: No murmur heard. Pulmonary:     Effort: Pulmonary effort is normal. No respiratory distress.     Breath sounds: Normal breath sounds. No wheezing or rales.  Musculoskeletal:     Cervical back: Normal range of motion and neck supple.  Skin:    General: Skin is warm and dry.     Capillary Refill: Capillary refill takes less than 2 seconds.  Neurological:     General: No focal deficit present.     Mental Status: She is alert and oriented  to person, place, and time. Mental status is at baseline.  Psychiatric:        Mood and Affect: Mood normal.        Behavior: Behavior normal.        Thought Content: Thought content normal.        Judgment: Judgment normal.     Results for orders placed or performed in visit on 06/18/22  TSH  Result Value Ref Range   TSH 1.700 0.450 - 4.500 uIU/mL  T4, free  Result Value Ref Range   Free T4 0.98 0.82 - 1.77 ng/dL      Assessment & Plan:   Problem List Items Addressed This Visit       Other   Depression, recurrent ([place]) - Primary    Chronic.  Controlled.  Continue with current medication regimen of Wellbutrin 150mg  XL.  Refill sent today.  Return to clinic in 3 months for reevaluation.  Call sooner if concerns arise.        Relevant Medications   traZODone (DESYREL) 50 MG tablet   buPROPion (WELLBUTRIN XL) 150 MG 24 hr tablet   Anxiety    Chronic.  Controlled.  Continue with current medication regimen of Wellbutrin 150mg  XL.  Refill sent today.   Return to clinic in 3 months for reevaluation.  Call sooner if concerns arise.       Relevant Medications   traZODone (DESYREL) 50 MG tablet   buPROPion (WELLBUTRIN XL) 150 MG 24 hr tablet   Other Visit Diagnoses     Sleep disturbance       Has tried Melatonin and Benadryl. Will start Trazodone 50mg . Side effects and benefits of medication discussed during visit. Follow up in 3 months.        Follow up plan: Return in about 3 months (around 10/19/2022) for Physical and Fasting labs.

## 2022-08-23 ENCOUNTER — Ambulatory Visit: Payer: 59 | Admitting: Obstetrics and Gynecology

## 2022-08-23 ENCOUNTER — Other Ambulatory Visit (HOSPITAL_COMMUNITY)
Admission: RE | Admit: 2022-08-23 | Discharge: 2022-08-23 | Disposition: A | Discharge status: Home / Self Care | Payer: 59 | Source: Ambulatory Visit | Attending: Obstetrics and Gynecology | Admitting: Obstetrics and Gynecology

## 2022-08-23 ENCOUNTER — Encounter: Payer: Self-pay | Admitting: Obstetrics and Gynecology

## 2022-08-23 VITALS — BP 125/82 | HR 87 | Resp 16 | Wt 190.1 lb

## 2022-08-23 DIAGNOSIS — Z1151 Encounter for screening for human papillomavirus (HPV): Secondary | ICD-10-CM | POA: Insufficient documentation

## 2022-08-23 DIAGNOSIS — B3731 Acute candidiasis of vulva and vagina: Secondary | ICD-10-CM

## 2022-08-23 DIAGNOSIS — Z30432 Encounter for removal of intrauterine contraceptive device: Secondary | ICD-10-CM

## 2022-08-23 DIAGNOSIS — Z124 Encounter for screening for malignant neoplasm of cervix: Secondary | ICD-10-CM | POA: Insufficient documentation

## 2022-08-23 MED ORDER — FLUCONAZOLE 150 MG PO TABS: 150.0000 mg | ORAL_TABLET | Freq: Once | ORAL | 0 refills | Status: AC

## 2022-08-23 NOTE — Patient Instructions (Signed)
I value your feedback and you entrusting us with your care. If you get a [facility] patient survey, I would appreciate you taking the time to let us know about your experience today. Thank you! ? ? ?

## 2022-08-23 NOTE — Progress Notes (Signed)
   Chief Complaint  Patient presents with   IUD Removal     History of Present Illness:  Jean Gay is a 31 y.o. that had a Mirena IUD placed 10/17/20. Since that time, she has menses Q1-2 months, spotting for 3-4 days, no dysmen. Hx of monthly menses prior to IUD. Husband with vasectomy. Notices increased white vag d/c with menses. No tx done. Uses scented soap and dryer sheets.  Due for pap. Last pap was normal 5/21  Past Medical History:  Diagnosis Date   PMDD (premenstrual dysphoric disorder)    Venomous snake bite 03/11/2017   History reviewed. No pertinent surgical history. Family History  Problem Relation Age of Onset   Diabetes Maternal Grandfather    Hypertension Maternal Grandfather    Heart disease Maternal Grandfather    Review of Systems  Constitutional:  Negative for fever.  Gastrointestinal:  Negative for blood in stool, constipation, diarrhea, nausea and vomiting.  Genitourinary:  Positive for vaginal discharge. Negative for dyspareunia, dysuria, flank pain, frequency, hematuria, urgency, vaginal bleeding and vaginal pain.  Musculoskeletal:  Negative for back pain.  Skin:  Negative for rash.    BP 125/82   Pulse 87   Resp 16   Wt 190 lb 1.6 oz (86.2 kg)   Breastfeeding No   BMI 31.94 kg/m   Pelvic exam:  Physical Exam Constitutional:      General: She is not in acute distress. Genitourinary:     Vulva normal.     Genitourinary Comments: ERYTHEMA BILAT LABIA MAJORA/MINORA     Right Labia: rash.     Right Labia: No tenderness or lesions.    Left Labia: rash.     Left Labia: No tenderness or lesions.    Vaginal discharge present.     No vaginal erythema, tenderness or bleeding.      Right Adnexa: not tender and no mass present.    Left Adnexa: not tender and no mass present.    No cervical motion tenderness or friability.     IUD strings visualized.     Uterus is not enlarged or tender.  Pulmonary:     Effort: Pulmonary effort is normal.   Musculoskeletal:        General: Normal range of motion.  Neurological:     General: No focal deficit present.     Mental Status: She is alert.     Cranial Nerves: No cranial nerve deficit.  Skin:    General: Skin is warm and dry.  Psychiatric:        Mood and Affect: Mood normal.        Behavior: Behavior normal.        Thought Content: Thought content normal.        Judgment: Judgment normal.  Vitals and nursing note reviewed.     IUD Removal Strings of IUD identified and grasped.  IUD removed without problem with ring forceps.  Pt tolerated this well.  IUD noted to be intact.  Assessment:  Cervical cancer screening - Plan: Cytology - PAP  Screening for HPV (human papillomavirus) - Plan: Cytology - PAP  Yeast vaginitis - Plan: fluconazole (DIFLUCAN) 150 MG tablet; pos sx and exam, no wet prep done. Rx diflucan. F/u prn.   Encounter for removal of intrauterine contraceptive device (IUD)--tolerated well.    Plan: IUD removed and plan for contraception is vasectomy. She was amenable to this plan.  Kalayla Shadden B. Azalya Galyon, PA-C 08/23/2022 3:21 PM

## 2022-08-27 ENCOUNTER — Encounter: Payer: Self-pay | Admitting: Obstetrics and Gynecology

## 2022-08-29 LAB — CYTOLOGY - PAP
Comment: NEGATIVE
Diagnosis: NEGATIVE
Diagnosis: REACTIVE
High risk HPV: NEGATIVE

## 2022-09-05 NOTE — Progress Notes (Signed)
Office Visit Note  Patient: Jean Gay             Date of Birth: [birth year]           MRN: [id number]             PCP: Jon Billings, NP Referring: Jon Billings, NP Visit Date: 09/18/2022 Occupation: _0 @  Subjective:  Jean Gay, positive rheumatoid factor  History of Present Illness: Jean Gay is a 31 y.o. female in consultation per request of her PCP for the evaluation of positive rheumatoid factor.  According to the patient she has had raynaud's phenominon since she was a teenager.  She recalls that several years ago she was experiencing some numbness in her hands and her feet at the time she was seen by neurologist and was offered nerve conduction velocity but she declined.  She has been experiencing off-and-on rashes for the last 8 years.  She states about 8 years ago she was treated with oral prednisone and topical prednisone for severe rash.  The rash is usually followed by bruising.  The rashes has become less frequent now.  She states usually the rashes on her face trunk and extremities.  The last episode was in August which was on her lower extremities and resolved by itself.  She saw an allergist in 2018 who related rash to her diet.  Has made some dietary modifications.  She states in January 2023 when she was 1 year postpartum she started experiencing increased fatigue and increased Jean Gay symptoms.  She was seen by her PCP at that time her rheumatoid factor was 72.  She was placed on Zoloft which she took for a while and then switched to Wellbutrin.  She did not notice any improvement in the Jean Gay symptoms.  She decided to come off the antidepressants.  She had repeat labs in June at the time her rheumatoid factor was 52.  She continues to have problems with Jean Gay.  She denies any joint pain or joint swelling.  There is no history of oral ulcers, nasal ulcers, malar rash, photosensitivity or lymphadenopathy.  There is no family history of autoimmune disease.   She is gravida 1, para 1, miscarriages 0.  Mild preeclampsia per patient.  Contraception- vasectomy.    Activities of Daily Living:  Patient reports morning stiffness for a few minutes.   Patient Reports nocturnal pain.  Difficulty dressing/grooming: Denies Difficulty climbing stairs: Denies Difficulty getting out of chair: Denies Difficulty using hands for taps, buttons, cutlery, and/or writing: Denies  Review of Systems  Constitutional:  Positive for fatigue.  HENT:  Negative for mouth sores and mouth dryness.   Eyes:  Positive for dryness.  Respiratory:  Positive for cough and shortness of breath.        Recent infection  Cardiovascular:  Positive for swelling in legs/feet. Negative for chest pain and palpitations.  Gastrointestinal:  Positive for diarrhea. Negative for blood in stool and constipation.  Endocrine: Negative for increased urination.  Genitourinary:  Negative for involuntary urination.  Musculoskeletal:  Positive for morning stiffness. Negative for joint pain, gait problem, joint pain, joint swelling, myalgias, muscle weakness, muscle tenderness and myalgias.  Skin:  Positive for color change. Negative for rash, hair loss and sensitivity to sunlight.  Allergic/Immunologic: Negative for susceptible to infections.  Neurological:  Positive for numbness and headaches. Negative for dizziness.  Hematological:  Negative for swollen glands.  Psychiatric/Behavioral:  Positive for depressed mood and sleep disturbance. The patient is not nervous/anxious.  PMFS History:  Patient Active Problem List   Diagnosis Date Noted   Depression, recurrent ([facility]) 10/16/2021   Anxiety 10/16/2021   Encounter for care or examination of lactating mother 08/31/2020   Postpartum care following vaginal delivery 08/31/2020   [redacted] weeks gestation of pregnancy 08/29/2020   Labor without complication 48/27/0786   Menorrhagia 02/03/2020   PMDD (premenstrual dysphoric disorder) 02/03/2020    Supervision of other normal pregnancy, antepartum 01/27/2020    Past Medical History:  Diagnosis Date   PMDD (premenstrual dysphoric disorder)    Venomous snake bite 03/11/2017    Family History  Problem Relation Age of Onset   Hypertension Father    Rashes / Skin problems Sister    Healthy Brother    Diabetes Maternal Grandfather    Hypertension Maternal Grandfather    Heart disease Maternal Grandfather    Healthy Son    Past Surgical History:  Procedure Laterality Date   WISDOM TOOTH EXTRACTION     Social History   Social History Narrative   Not on file   Immunization History  Administered Date(s) Administered   Influenza,inj,Quad PF,6+ Mos 10/16/2021   Influenza-Unspecified 06/07/2011, 07/09/2012   MMR 08/31/2020   Moderna Sars-Covid-2 Vaccination 05/27/2020, 06/24/2020   PPD Test 09/22/2012   Tdap 08/12/2012, 07/05/2020     Objective: Vital Signs: BP 124/86 (BP Location: Right Arm, Patient Position: Sitting, Cuff Size: Normal)   Pulse 83   Resp 16   Ht _0  (1.676 m)   Wt 186 lb (84.4 kg)   BMI 30.02 kg/m    Physical Exam Vitals and nursing note reviewed.  Constitutional:      Appearance: She is well-developed.  HENT:     Head: Normocephalic and atraumatic.  Eyes:     Conjunctiva/sclera: Conjunctivae normal.  Cardiovascular:     Rate and Rhythm: Normal rate and regular rhythm.     Heart sounds: Normal heart sounds.  Pulmonary:     Effort: Pulmonary effort is normal.     Breath sounds: Normal breath sounds.  Abdominal:     General: Bowel sounds are normal.     Palpations: Abdomen is soft.  Musculoskeletal:     Cervical back: Normal range of motion.  Lymphadenopathy:     Cervical: No cervical adenopathy.  Skin:    General: Skin is warm and dry.     Capillary Refill: Capillary refill takes less than 2 seconds.  Neurological:     Mental Status: She is alert and oriented to person, place, and time.  Psychiatric:        Behavior: Behavior normal.       Musculoskeletal Exam: Cervical, thoracic and lumbar spine were in good range of motion.  Shoulder joints, elbow joints, wrist joints, MCPs PIPs and DIPs been good range of motion with no synovitis.  Hip joints, knee joints, ankles, MTPs and PIPs been good range of motion with no synovitis.  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  CDAI Exam: CDAI Score: -- Patient Global: --; Provider Global: -- Swollen: --; Tender: -- Joint Exam 09/18/2022   No joint exam has been documented for this visit   There is currently no information documented on the homunculus. Go to the Rheumatology activity and complete the homunculus joint exam.  Investigation: No additional findings.  Imaging: No results found.  Recent Labs: Lab Results  Component Value Date   WBC 7.7 02/19/2022   HGB 14.0 02/19/2022   PLT 260 02/19/2022   NA 140 02/19/2022  K 3.9 02/19/2022   CL 100 02/19/2022   CO2 22 02/19/2022   GLUCOSE 94 02/19/2022   BUN 12 02/19/2022   CREATININE 1.00 02/19/2022   BILITOT 0.4 02/19/2022   ALKPHOS 87 02/19/2022   AST 22 02/19/2022   ALT 26 02/19/2022   PROT 8.0 02/19/2022   ALBUMIN 5.3 (H) 02/19/2022   CALCIUM 9.8 02/19/2022   GFRAA >60 02/08/2020    Speciality Comments: No specialty comments available.  Procedures:  No procedures performed Allergies: Oseltamivir   Assessment / Plan:     Visit Diagnoses: Rheumatoid factor positive - 02/19/22:RF 52, ANA negative, dsDNA<1, RNP-, SM-, Scl-70-, Ro-, La-, Chromatin-, Anti-JO1-, centromere-, RMSF IgG+, CK 61, uric acid 5.4 -patient had positive rheumatoid factor in early 2023 and then repeat rheumatoid factor was positive.  She denies any history of joint stiffness or joint swelling.  No synovitis was noted.  I advised her to contact me if she develops any increased joint swelling.  Plan: Cyclic citrul peptide antibody, IgG  Raynaud's disease without gangrene -she gives history of Raynaud's phenomenon for the last 10 years.   She states the Raynaud's phenomenon has been worse in the last 1 year.  Her hands and feet always stay cold and numb.  She notices discoloration in her hands and feet.  Plan: C3 and C4, Beta-2 glycoprotein antibodies, Cardiolipin antibodies, IgG, IgM, IgA, Lupus Anticoagulant Eval w/Reflex, Angiotensin converting enzyme, Cryoglobulin, Pan-ANCA.  I discussed the option of adding amlodipine.  Indications side effects contraindications were discussed at length.  I will send a prescription for amlodipine 5 mg p.o. nightly.  Patient will try half a tablet first and if tolerated she will increase amlodipine to 1 tablet p.o. nightly.  She was advised to monitor blood pressure closely.  Keeping core temperature warm and warm clothing was discussed.  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  She denies any joint discomfort.  Need for hepatitis B screening test - Plan: Hepatitis B core antibody, IgM, Hepatitis B surface antigen  Rash-she gives history of intermittent rash for the last 8 years.  She has been evaluated by allergist.  Dietary modifications helped frequency of rash.  Patient states that the last episode was rash off was on her lower extremities which was in August 2023.  She brought some pictures on her iPhone which showed bruising on her lower extremities.  Patient states the rash bleeds bruising after it resolves.  Other fatigue-she relates to having 2 small children and her work schedule.  Anxiety and depression-she was on Zoloft and Wellbutrin in the past which she discontinued.  She did not notice any improvement in the Jean Gay on antidepressants.  PMDD (premenstrual dysphoric disorder)  Orders: Orders Placed This Encounter  Procedures   Cyclic citrul peptide antibody, IgG   C3 and C4   Beta-2 glycoprotein antibodies   Cardiolipin antibodies, IgG, IgM, IgA   Lupus Anticoagulant Eval w/Reflex   Angiotensin converting enzyme   Cryoglobulin   Pan-ANCA    Hepatitis B core antibody, IgM   Hepatitis B surface antigen   Meds ordered this encounter  Medications   amLODipine (NORVASC) 5 MG tablet    Sig: Take 1 tablet (5 mg total) by mouth at bedtime.    Dispense:  30 tablet    Refill:  2     Follow-Up Instructions: Return for Positive rheumatoid factor, Jean Gay.   Bo Merino, MD  Note - This record has been created using Editor, commissioning.  Chart creation  errors have been sought, but may not always  have been located. Such creation errors do not reflect on  the standard of medical care.  

## 2022-09-18 ENCOUNTER — Encounter: Payer: Self-pay | Admitting: Rheumatology

## 2022-09-18 ENCOUNTER — Ambulatory Visit: Payer: 59 | Attending: Rheumatology | Admitting: Rheumatology

## 2022-09-18 VITALS — BP 124/86 | HR 83 | Resp 16 | Ht 66.0 in | Wt 186.0 lb

## 2022-09-18 DIAGNOSIS — F3281 Premenstrual dysphoric disorder: Secondary | ICD-10-CM

## 2022-09-18 DIAGNOSIS — M249 Joint derangement, unspecified: Secondary | ICD-10-CM

## 2022-09-18 DIAGNOSIS — F419 Anxiety disorder, unspecified: Secondary | ICD-10-CM

## 2022-09-18 DIAGNOSIS — I7300 Raynaud's syndrome without gangrene: Secondary | ICD-10-CM

## 2022-09-18 DIAGNOSIS — R768 Other specified abnormal immunological findings in serum: Secondary | ICD-10-CM

## 2022-09-18 DIAGNOSIS — F32A Depression, unspecified: Secondary | ICD-10-CM

## 2022-09-18 DIAGNOSIS — Z1159 Encounter for screening for other viral diseases: Secondary | ICD-10-CM

## 2022-09-18 DIAGNOSIS — R21 Rash and other nonspecific skin eruption: Secondary | ICD-10-CM

## 2022-09-18 DIAGNOSIS — R5383 Other fatigue: Secondary | ICD-10-CM

## 2022-09-18 MED ORDER — AMLODIPINE BESYLATE 5 MG PO TABS: 5.0000 mg | ORAL_TABLET | Freq: Every day | ORAL | 2 refills | Status: DC

## 2022-09-18 NOTE — Patient Instructions (Addendum)
Take amlodipine 5 mg one half tablet by mouth at bedtime.  If your blood pressure stays normal then you may increase the dose of amlodipine to 5 mg tablet, 1 tablet by mouth at bedtime.  A decrease in blood pressure may cause dizziness.  Next available new patient follow-up

## 2022-09-25 NOTE — Progress Notes (Signed)
Office Visit Note  Patient: Jean Gay             Date of Birth: 03-Jan-1991           MRN: [id number]             PCP: Larae Grooms, NP Referring: Larae Grooms, NP Visit Date: 10/08/2022 Occupation: @GUAROCC @  Subjective:  [name]  History of Present Illness: Jean Gay is a 32 y.o. female with history of Raynaud's phenomenon.  She states she continues to have cold hands and cold feet.  She has been taking amlodipine since the last visit.  She has not noticed any improvement in her cold symptoms.  She denies any history of digital ulcers.  She had no recurrence of rash since the last visit.  She denies any history of joint pain or joint swelling.  She continues to have fatigue which she relates to not sleeping well.  There is no history of oral ulcers, nasal ulcers, malar rash, photosensitivity, hair loss or lymphadenopathy.    Activities of Daily Living:  Patient reports morning stiffness for a few minutes.   Patient Denies nocturnal pain.  Difficulty dressing/grooming: Denies Difficulty climbing stairs: Denies Difficulty getting out of chair: Denies Difficulty using hands for taps, buttons, cutlery, and/or writing: Denies  Review of Systems  Constitutional:  Positive for fatigue.  HENT:  Negative for mouth sores and mouth dryness.   Eyes:  Positive for dryness.  Respiratory:  Negative for shortness of breath.   Cardiovascular:  Negative for chest pain and palpitations.  Gastrointestinal:  Negative for blood in stool, constipation and diarrhea.  Endocrine: Positive for cold intolerance. Negative for increased urination.  Genitourinary:  Negative for involuntary urination.  Musculoskeletal:  Positive for morning stiffness. Negative for joint pain, gait problem, joint pain, joint swelling, myalgias, muscle weakness, muscle tenderness and myalgias.  Skin:  Positive for color change. Negative for rash, hair loss and sensitivity to  sunlight.  Allergic/Immunologic: Positive for susceptible to infections.  Neurological:  Positive for numbness and headaches. Negative for dizziness.  Hematological:  Negative for swollen glands.  Psychiatric/Behavioral:  Positive for depressed mood and sleep disturbance. The patient is nervous/anxious.     PMFS History:  Patient Active Problem List   Diagnosis Date Noted   Depression, recurrent ([facility]) 10/16/2021   Anxiety 10/16/2021   Encounter for care or examination of lactating mother 08/31/2020   Postpartum care following vaginal delivery 08/31/2020   [redacted] weeks gestation of pregnancy 08/29/2020   Labor without complication 08/29/2020   Menorrhagia 02/03/2020   PMDD (premenstrual dysphoric disorder) 02/03/2020   Supervision of other normal pregnancy, antepartum 01/27/2020    Past Medical History:  Diagnosis Date   PMDD (premenstrual dysphoric disorder)    Venomous snake bite 03/11/2017    Family History  Problem Relation Age of Onset   Hypertension Father    Rashes / Skin problems Sister    Healthy Brother    Diabetes Maternal Grandfather    Hypertension Maternal Grandfather    Heart disease Maternal Grandfather    Healthy Son    Past Surgical History:  Procedure Laterality Date   WISDOM TOOTH EXTRACTION     Social History   Social History Narrative   Not on file   Immunization History  Administered Date(s) Administered   Influenza,inj,Quad PF,6+ Mos 10/16/2021   Influenza-Unspecified 06/07/2011, 07/09/2012   MMR 08/31/2020   Moderna Sars-Covid-2 Vaccination 05/27/2020, 06/24/2020   PPD Test 09/22/2012   Tdap  08/12/2012, 07/05/2020     Objective: Vital Signs: BP (!) 135/90 (BP Location: Left Arm, Patient Position: Sitting, Cuff Size: Normal)   Pulse 70   Resp 16   Ht 5\' 6"  (1.676 m)   Wt 186 lb 6.4 oz (84.6 kg)   LMP 09/25/2022 (Exact Date)   BMI 30.09 kg/m    Physical Exam Vitals and nursing note reviewed.  Constitutional:      Appearance: She is  well-developed.  HENT:     Head: Normocephalic and atraumatic.  Eyes:     Conjunctiva/sclera: Conjunctivae normal.  Cardiovascular:     Rate and Rhythm: Normal rate and regular rhythm.     Heart sounds: Normal heart sounds.  Pulmonary:     Effort: Pulmonary effort is normal.     Breath sounds: Normal breath sounds.  Abdominal:     General: Bowel sounds are normal.     Palpations: Abdomen is soft.  Musculoskeletal:     Cervical back: Normal range of motion.  Lymphadenopathy:     Cervical: No cervical adenopathy.  Skin:    General: Skin is warm and dry.     Capillary Refill: Capillary refill takes 2 to 3 seconds.     Comments: No nailbed capillary changes or sclerodactyly was noted.  Neurological:     Mental Status: She is alert and oriented to person, place, and time.  Psychiatric:        Behavior: Behavior normal.      Musculoskeletal Exam: Cervical, thoracic and lumbar spine were in good range of motion.  Shoulder joints, elbow joints, wrist joints, MCPs PIPs and DIPs with good range of motion with no synovitis.  Hip joints, knee joints, ankles, MTPs and PIPs with good range of motion with no synovitis.  CDAI Exam: CDAI Score: -- Patient Global: --; Provider Global: -- Swollen: --; Tender: -- Joint Exam 10/08/2022   No joint exam has been documented for this visit   There is currently no information documented on the homunculus. Go to the Rheumatology activity and complete the homunculus joint exam.  Investigation: No additional findings.  Imaging: No results found.  Recent Labs: Lab Results  Component Value Date   WBC 7.7 02/19/2022   HGB 14.0 02/19/2022   PLT 260 02/19/2022   NA 140 02/19/2022   K 3.9 02/19/2022   CL 100 02/19/2022   CO2 22 02/19/2022   GLUCOSE 94 02/19/2022   BUN 12 02/19/2022   CREATININE 1.00 02/19/2022   BILITOT 0.4 02/19/2022   ALKPHOS 87 02/19/2022   AST 22 02/19/2022   ALT 26 02/19/2022   PROT 8.0 02/19/2022   ALBUMIN 5.3 (H)  02/19/2022   CALCIUM 9.8 02/19/2022   GFRAA >60 02/08/2020     02/19/22:RF 52, ANA negative, dsDNA<1, RNP-, SM-, Scl-70-, Ro-, La-, Chromatin-, Anti-JO1-, centromere-, RMSF IgG+, CK 61, uric acid 5.4   Speciality Comments: No specialty comments available.  Procedures:  No procedures performed Allergies: Oseltamivir   Assessment / Plan:     Visit Diagnoses: Raynaud's disease without gangrene - History of [name] for 10 years which is progressively getting worse.  There is no history of digital ulcers.  She had decreased capillary refill without any nailbed capillary changes or sclerodactyly.  Positive lupus anticoagulant noted.September 18, 2022 Lupus anticoagulant positive, anticardiolipin negative, beta-2 GP 1 negative, ANCA negative, MPO negative, serum proteinase 3 negative, C3-C4 normal, anti-CCP negative, ACE 18, cryoglobulins negative, hepatitis B negative.  Lab results were discussed with the patient.  I will  repeat lupus anticoagulant in 3 months.  She was advised to contact us if she develops any new symptoms.  She has been taking amlodipine 5 mg p.o. daily but has not noticed any improvement in her symptoms.  Use of hand warmers, warm clothing and keeping core temperature warm was discussed.  A handout on [name] was given.  Lupus anticoagulant positive - Plan to repeat lupus anticoagulant in 3 months. - Plan: Lupus Anticoagulant Eval w/Reflex  Rheumatoid factor positive - RF 52 on February 19, 2022.  No synovitis was noted.  Patient was advised to contact us if she develops any swelling. - Plan: Rheumatoid factor  Hypermobility of joint-probably contributes to some arthralgias.  Rash -history of intermittent rash for the last 8 years.  Pictures were seen on her iPhone.  The rash bleeds and bruises after it resolves per patient.  She had no recent episodes of rash.  Other fatigue-she continues to have fatigue which she relates to her workload and not sleeping well.  Anxiety and  depression - ttd with Zoloft and Wellbutrin in the past.  She is currently on no medications.  PMDD (premenstrual dysphoric disorder)  Orders: Orders Placed This Encounter  Procedures   Rheumatoid factor   Lupus Anticoagulant Eval w/Reflex   No orders of the defined types were placed in this encounter.   Follow-Up Instructions: No follow-ups on file.   Pollyann Savoy, MD  Note - This record has been created using Animal nutritionist.  Chart creation errors have been sought, but may not always  have been located. Such creation errors do not reflect on  the standard of medical care.

## 2022-09-26 LAB — HEPATITIS B CORE ANTIBODY, IGM: Hep B C IgM: NONREACTIVE

## 2022-09-26 LAB — PAN-ANCA
ANCA SCREEN: NEGATIVE
Myeloperoxidase Abs: <1 AI (ref <1.0)
Serine Protease 3: <1 AI (ref <1.0)

## 2022-09-26 LAB — HEPATITIS B SURFACE ANTIGEN: Hepatitis B Surface Ag: NONREACTIVE

## 2022-09-26 LAB — CARDIOLIPIN ANTIBODIES, IGG, IGM, IGA
Anticardiolipin IgA: <2 APL-U/mL (ref <20.0)
Anticardiolipin IgG: <2 GPL-U/mL (ref <20.0)
Anticardiolipin IgM: <2 MPL-U/mL (ref <20.0)

## 2022-09-26 LAB — THROMBIN CLOTTING TIME: Thrombin Clotting Time: 17 s (ref 13–19)

## 2022-09-26 LAB — CYCLIC CITRUL PEPTIDE ANTIBODY, IGG: Cyclic Citrullin Peptide Ab: <16 UNITS

## 2022-09-26 LAB — BETA-2 GLYCOPROTEIN ANTIBODIES
Beta-2 Glyco 1 IgA: <2 U/mL (ref <20.0)
Beta-2 Glyco 1 IgM: <2 U/mL (ref <20.0)
Beta-2 Glyco I IgG: <2 U/mL (ref <20.0)

## 2022-09-26 LAB — RFLX HEXAGONAL PHASE CONFIRM: Hexagonal Phase Conf: POSITIVE — AB

## 2022-09-26 LAB — C3 AND C4
C3 Complement: 146 mg/dL (ref 83–193)
C4 Complement: 27 mg/dL (ref 15–57)

## 2022-09-26 LAB — ANGIOTENSIN CONVERTING ENZYME: Angiotensin-Converting Enzyme: 18 U/L (ref 9–67)

## 2022-09-26 LAB — LUPUS ANTICOAGULANT EVAL W/ REFLEX
PTT-LA Screen: 41 s — ABNORMAL HIGH (ref <=40)
dRVVT: 31 s (ref <=45)

## 2022-09-26 LAB — CRYOGLOBULIN: Cryoglobulin, Qualitative Analysis: NOT DETECTED

## 2022-09-26 NOTE — Progress Notes (Signed)
I will discuss results at the follow-up visit.

## 2022-09-27 ENCOUNTER — Ambulatory Visit: Payer: Self-pay | Admitting: *Deleted

## 2022-09-27 NOTE — Telephone Encounter (Signed)
Reason for Disposition  [1] Continuous (nonstop) coughing interferes with work or school AND [2] no improvement using cough treatment per Care Advice  Answer Assessment - Initial Assessment Questions 1. ONSET: "When did the cough begin?"      I have a heavy chest, headache, tired.   Coughing a lot.    Started 3 weeks ago.   I've taken OTC medications for cough and congestion and nothing is working.   My fever is fluctuating.   Low grade to no fever. 2. SEVERITY: "How bad is the cough today?"      It's not going away.     My chest is heavy feeling from all the coughing I'm doing.   I have these awful coughing fits. 3. SPUTUM: "Describe the color of your sputum" (none, dry cough; clear, white, yellow, green)     I'm not really coughing up anything.   It's a wet cough. 4. HEMOPTYSIS: "Are you coughing up any blood?" If so ask: "How much?" (flecks, streaks, tablespoons, etc.)     Not asked 5. DIFFICULTY BREATHING: "Are you having difficulty breathing?" If Yes, ask: "How bad is it?" (e.g., mild, moderate, severe)    - MILD: No SOB at rest, mild SOB with walking, speaks normally in sentences, can lie down, no retractions, pulse < 100.    - MODERATE: SOB at rest, SOB with minimal exertion and prefers to sit, cannot lie down flat, speaks in phrases, mild retractions, audible wheezing, pulse 100-120.    - SEVERE: Very SOB at rest, speaks in single words, struggling to breathe, sitting hunched forward, retractions, pulse > 120      I'm short of breath at rest and up walking around. 6. FEVER: "Do you have a fever?" If Yes, ask: "What is your temperature, how was it measured, and when did it start?"     Fluctuating up and down. 7. CARDIAC HISTORY: "Do you have any history of heart disease?" (e.g., heart attack, congestive heart failure)      No 8. LUNG HISTORY: "Do you have any history of lung disease?"  (e.g., pulmonary embolus, asthma, emphysema)     No 9. PE RISK FACTORS: "Do you have a history of  blood clots?" (or: recent major surgery, recent prolonged travel, bedridden)     I might have an autoimmune disorder I think.    I'm being worked up for it now. 10. OTHER SYMPTOMS: "Do you have any other symptoms?" (e.g., runny nose, wheezing, chest pain)       Chest heaviness and wheezing, a lot of coughing,    I did have a sore throat for a week in the beginning.   Now it's from coughing so much. 11. PREGNANCY: "Is there any chance you are pregnant?" "When was your last menstrual period?"       Not asked 12. TRAVEL: "Have you traveled out of the country in the last month?" (e.g., travel history, exposures)       Not asked  Protocols used: Cough - Acute Productive-A-AH

## 2022-09-27 NOTE — Telephone Encounter (Signed)
  Chief Complaint: Coughing a lot a wet cough but not really bringing up anything.   Chest is heavy and sore from coughing so much.  Having shortness of breath.  Very fatigued. Symptoms: Coughing a lot, scratchy throat from coughing so much.    Frequency: For last 3 weeks.   OTC medications not helping Pertinent Negatives: Patient denies getting better. Disposition: [] ED /[] Urgent Care (no appt availability in office) / [x] Appointment(In office/virtual)/ []  [facility]/ [] Home Care/ [] Refused Recommended Disposition /[] [facility]/ []  Follow-up with PCP Additional Notes: Appt made with Jon Billings, NP for 09/28/2022 at 8:20 AM.

## 2022-09-28 ENCOUNTER — Encounter: Payer: Self-pay | Admitting: Nurse Practitioner

## 2022-09-28 ENCOUNTER — Ambulatory Visit: Payer: Medicaid Other | Admitting: Nurse Practitioner

## 2022-09-28 VITALS — BP 118/73 | HR 80 | Temp 99.0°F | Wt 184.8 lb

## 2022-09-28 DIAGNOSIS — J069 Acute upper respiratory infection, unspecified: Secondary | ICD-10-CM

## 2022-09-28 DIAGNOSIS — J988 Other specified respiratory disorders: Secondary | ICD-10-CM

## 2022-09-28 MED ORDER — AZITHROMYCIN 250 MG PO TABS: ORAL_TABLET | ORAL | 0 refills | Status: AC

## 2022-09-28 NOTE — Progress Notes (Signed)
BP 118/73   Pulse 80   Temp 99 F (37.2 C) (Oral)   Wt 184 lb 12.8 oz (83.8 kg)   LMP 09/25/2022 (Exact Date)   SpO2 98%   BMI 29.83 kg/m    Subjective:    Patient ID: Jean Gay, female    DOB: 1991-06-18, 32 y.o.   MRN: [id number]  HPI: Jean Gay is a 32 y.o. female  Chief Complaint  Patient presents with   URI    Pt states she has had a cough and congestion for the last 3 weeks. States it has moved into her chest. States she has tried OTC medications but symptoms are not going away.    UPPER RESPIRATORY TRACT INFECTION Worst symptom: symptoms have been ongoing x 3 weeks.  At home COVID tests were negative. Fever:  off and on Cough: yes Shortness of breath: yes Wheezing: no Chest pain: no Chest tightness: yes Chest congestion: yes Nasal congestion: no Runny nose: yes Post nasal drip: yes Sneezing: no Sore throat: yes Swollen glands: no Sinus pressure: no Headache: yes Face pain: no Toothache: no Ear pain: no bilateral Ear pressure: no bilateral Eyes red/itching:no Eye drainage/crusting: no  Vomiting: no Rash: no Fatigue: yes Sick contacts: no Strep contacts: no  Context: fluctuating Recurrent sinusitis: no Relief with OTC cold/cough medications: yes  Treatments attempted: cold/sinus   Relevant past medical, surgical, family and social history reviewed and updated as indicated. Interim medical history since our last visit reviewed. Allergies and medications reviewed and updated.  Review of Systems  Constitutional:  Positive for fatigue and fever.  HENT:  Positive for congestion, postnasal drip, rhinorrhea and sore throat. Negative for dental problem, ear pain, sinus pressure, sinus pain and sneezing.   Respiratory:  Positive for cough, chest tightness and shortness of breath. Negative for wheezing.   Cardiovascular:  Negative for chest pain.  Gastrointestinal:  Negative for vomiting.  Skin:  Negative for rash.  Neurological:  Positive for  headaches.    Per HPI unless specifically indicated above     Objective:    BP 118/73   Pulse 80   Temp 99 F (37.2 C) (Oral)   Wt 184 lb 12.8 oz (83.8 kg)   LMP 09/25/2022 (Exact Date)   SpO2 98%   BMI 29.83 kg/m   Wt Readings from Last 3 Encounters:  09/28/22 184 lb 12.8 oz (83.8 kg)  09/18/22 186 lb (84.4 kg)  08/23/22 190 lb 1.6 oz (86.2 kg)    Physical Exam Vitals and nursing note reviewed.  Constitutional:      General: She is not in acute distress.    Appearance: Normal appearance. She is normal weight. She is not ill-appearing, toxic-appearing or diaphoretic.  HENT:     Head: Normocephalic.     Right Ear: Tympanic membrane and external ear normal.     Left Ear: Tympanic membrane and external ear normal.     Nose: Congestion and rhinorrhea present.     Mouth/Throat:     Mouth: Mucous membranes are moist.     Pharynx: Oropharynx is clear. Posterior oropharyngeal erythema present. No oropharyngeal exudate.  Eyes:     General:        Right eye: No discharge.        Left eye: No discharge.     Extraocular Movements: Extraocular movements intact.     Conjunctiva/sclera: Conjunctivae normal.     Pupils: Pupils are equal, round, and reactive to light.  Cardiovascular:  Rate and Rhythm: Normal rate and regular rhythm.     Heart sounds: No murmur heard. Pulmonary:     Effort: Pulmonary effort is normal. No respiratory distress.     Breath sounds: Wheezing present. No rales.  Musculoskeletal:     Cervical back: Normal range of motion and neck supple.  Skin:    General: Skin is warm and dry.     Capillary Refill: Capillary refill takes less than 2 seconds.  Neurological:     General: No focal deficit present.     Mental Status: She is alert and oriented to person, place, and time. Mental status is at baseline.  Psychiatric:        Mood and Affect: Mood normal.        Behavior: Behavior normal.        Thought Content: Thought content normal.        Judgment:  Judgment normal.     Results for orders placed or performed in visit on 40/34/74  Cyclic citrul peptide antibody, IgG  Result Value Ref Range   Cyclic Citrullin Peptide Ab <16 UNITS  C3 and C4  Result Value Ref Range   C3 Complement 146 83 - 193 mg/dL   C4 Complement 27 15 - 57 mg/dL  Beta-2 glycoprotein antibodies  Result Value Ref Range   Beta-2 Glyco I IgG <2.0 <20.0 U/mL   Beta-2 Glyco 1 IgM <2.0 <20.0 U/mL   Beta-2 Glyco 1 IgA <2.0 <20.0 U/mL  Cardiolipin antibodies, IgG, IgM, IgA  Result Value Ref Range   Anticardiolipin IgA <2.0 <20.0 APL-U/mL   Anticardiolipin IgG <2.0 <20.0 GPL-U/mL   Anticardiolipin IgM <2.0 <20.0 MPL-U/mL  Lupus Anticoagulant Eval w/Reflex  Result Value Ref Range   Lupus Anticoagulant see note (A)    PTT-LA Screen 41 (H) <=40 sec   dRVVT 31 <=45 sec  Angiotensin converting enzyme  Result Value Ref Range   Angiotensin-Converting Enzyme 18 9 - 67 U/L  Cryoglobulin  Result Value Ref Range   Cryoglobulin, Qualitative Analysis None Detected None Detected  Pan-ANCA  Result Value Ref Range   ANCA SCREEN Negative Negative   Myeloperoxidase Abs <1.0 <1.0 AI   Serine Protease 3 <1.0 <1.0 AI  Hepatitis B core antibody, IgM  Result Value Ref Range   Hep B C IgM NON-REACTIVE NON-REACTIVE  Hepatitis B surface antigen  Result Value Ref Range   Hepatitis B Surface Ag NON-REACTIVE NON-REACTIVE  rflx hexagonal Phase Confirm  Result Value Ref Range   Hexagonal Phase Conf Weak Positive (A) Negative  Thrombin Clotting Time  Result Value Ref Range   Thrombin Clotting Time 17 13 - 19 sec      Assessment & Plan:   Problem List Items Addressed This Visit   None Visit Diagnoses     Congestion of upper airway    -  Primary   Will treat with zpak due to ongoing symptoms x 3 weeks that keep fluctuating.  Continue with OTC symptom management. FU if not improved.        Follow up plan: Return if symptoms worsen or fail to improve.

## 2022-10-08 ENCOUNTER — Encounter: Payer: Self-pay | Admitting: Rheumatology

## 2022-10-08 ENCOUNTER — Ambulatory Visit: Payer: 59 | Attending: Rheumatology | Admitting: Rheumatology

## 2022-10-08 VITALS — BP 134/87 | HR 76 | Resp 16 | Ht 66.0 in | Wt 186.4 lb

## 2022-10-08 DIAGNOSIS — F419 Anxiety disorder, unspecified: Secondary | ICD-10-CM | POA: Diagnosis not present

## 2022-10-08 DIAGNOSIS — I7300 Raynaud's syndrome without gangrene: Secondary | ICD-10-CM

## 2022-10-08 DIAGNOSIS — F32A Depression, unspecified: Secondary | ICD-10-CM

## 2022-10-08 DIAGNOSIS — R768 Other specified abnormal immunological findings in serum: Secondary | ICD-10-CM

## 2022-10-08 DIAGNOSIS — F3281 Premenstrual dysphoric disorder: Secondary | ICD-10-CM

## 2022-10-08 DIAGNOSIS — R21 Rash and other nonspecific skin eruption: Secondary | ICD-10-CM

## 2022-10-08 DIAGNOSIS — M249 Joint derangement, unspecified: Secondary | ICD-10-CM | POA: Diagnosis not present

## 2022-10-08 DIAGNOSIS — R760 Raised antibody titer: Secondary | ICD-10-CM | POA: Diagnosis not present

## 2022-10-08 DIAGNOSIS — R5383 Other fatigue: Secondary | ICD-10-CM | POA: Diagnosis not present

## 2022-10-08 NOTE — Patient Instructions (Signed)
Raynaud's Phenomenon  Raynaud's phenomenon is a condition that affects the blood vessels (arteries) that carry blood to the fingers and toes. The arteries that supply blood to the ears, lips, nipples, or the tip of the nose might also be affected. Raynaud's phenomenon causes the arteries to become narrow temporarily (spasm). As a result, the flow of blood to the affected areas is temporarily decreased. This usually occurs in response to cold temperatures or stress. During an attack, the skin in the affected areas turns white, then blue, and finally red. A person may also feel tingling or numbness in those areas. Attacks usually last for only a brief period, and then the blood flow to the area returns to normal. In most cases, Raynaud's phenomenon does not cause serious health problems. What are the causes? In many cases, the cause of this condition is not known. The condition may occur on its own (primary Raynaud's phenomenon) or may be associated with other diseases or factors (secondary Raynaud's phenomenon). Possible causes may include: Diseases or medical conditions that damage the arteries. Injuries and repetitive actions that hurt the hands or feet. Being exposed to certain chemicals. Taking medicines that narrow the arteries. Other medical conditions, such as lupus, scleroderma, rheumatoid arthritis, thyroid problems, blood disorders, Sjogren syndrome, or atherosclerosis. What increases the risk? The following factors may make you more likely to develop this condition: Being 20-40 years old. Being female. Having a family history of Raynaud's phenomenon. Living in a cold climate. Smoking. What are the signs or symptoms? Symptoms of this condition usually occur when you are exposed to cold temperatures or when you have emotional stress. The symptoms may last for a few minutes or up to several hours. They usually affect your fingers but may also affect your toes, nipples, lips, ears, or the  tip of your nose. Symptoms may include: Changes in skin color. The skin in the affected areas will turn pale or white. The skin may then change from white to bluish to red as normal blood flow returns to the area. Numbness, tingling, or pain in the affected areas. In severe cases, symptoms may include: Skin sores. Tissues decaying and dying (gangrene). How is this diagnosed? This condition may be diagnosed based on: Your symptoms and medical history. A physical exam. During the exam, you may be asked to put your hands in cold water to check for a reaction to cold temperature. Tests, such as: Blood tests to check for other diseases or conditions. A test to check the movement of blood through your arteries and veins (vascular ultrasound). A test in which the skin at the base of your fingernail is examined under a microscope (nailfold capillaroscopy). How is this treated? During an episode, you can take actions to help symptoms go away faster. Options include moving your arms around in a windmill pattern, warming your fingers under warm water, or placing your fingers in a warm body fold, such as your armpit. Long-term treatment for this condition often involves making lifestyle changes and taking steps to control your exposure to cold temperature. For more severe cases, medicine (calcium channel blockers) may be used to improve blood circulation. Follow these instructions at home: Avoiding cold temperatures Take these steps to avoid exposure to cold: If possible, stay indoors during cold weather. When you go outside during cold weather, dress in layers and wear mittens, a hat, a scarf, and warm footwear. Wear mittens or gloves when handling ice or frozen food. Use holders for glasses or cans containing   cold drinks. Let warm water run for a while before taking a shower or bath. Warm up the car before driving in cold weather. Lifestyle If possible, avoid stressful and emotional situations. Try  to find ways to manage your stress, such as: Exercise. Yoga. Meditation. Biofeedback. Do not use any products that contain nicotine or tobacco. These products include cigarettes, chewing tobacco, and vaping devices, such as e-cigarettes. If you need help quitting, ask your health care provider. Avoid secondhand smoke. Limit your use of caffeine. Switch to decaffeinated coffee, tea, and soda. Avoid chocolate. Avoid vibrating tools and machinery. General instructions Protect your hands and feet from injuries, cuts, or bruises. Avoid wearing tight rings or wristbands. Wear loose fitting socks and comfortable, roomy shoes. Take over-the-counter and prescription medicines only as told by your health care provider. Where to find support Raynaud's Association: www.raynauds.org Where to find more information National Institute of Arthritis and Musculoskeletal and Skin Diseases: www.niams.nih.gov Contact a health care provider if: Your discomfort becomes worse despite lifestyle changes. You develop sores on your fingers or toes that do not heal. You have breaks in the skin on your fingers or toes. You have a fever. You have pain or swelling in your joints. You have a rash. Your symptoms occur on only one side of your body. Get help right away if: Your fingers or toes turn black. You have severe pain in the affected areas. These symptoms may represent a serious problem that is an emergency. Do not wait to see if the symptoms will go away. Get medical help right away. Call your local emergency services (911 in the [place] Do not drive yourself to the hospital. Summary Raynaud's phenomenon is a condition that affects the arteries that carry blood to the fingers, toes, ears, lips, nipples, or the tip of the nose. In many cases, the cause of this condition is not known. Symptoms of this condition include changes in skin color along with numbness and tingling in the affected area. Treatment for  this condition includes lifestyle changes and reducing exposure to cold temperatures. Medicines may be used for severe cases of the condition. Contact your health care provider if your condition worsens despite treatment. This information is not intended to replace advice given to you by your health care provider. Make sure you discuss any questions you have with your health care provider. Document Revised: 11/08/2020 Document Reviewed: 11/08/2020 Elsevier Patient Education  2023 [employer]  

## 2022-10-22 ENCOUNTER — Encounter: Payer: 59 | Admitting: Nurse Practitioner

## 2022-10-30 ENCOUNTER — Ambulatory Visit (INDEPENDENT_AMBULATORY_CARE_PROVIDER_SITE_OTHER): Payer: 59 | Admitting: Nurse Practitioner

## 2022-10-30 ENCOUNTER — Encounter: Payer: Self-pay | Admitting: Nurse Practitioner

## 2022-10-30 VITALS — BP 108/74 | HR 86 | Temp 98.4°F | Wt 182.0 lb

## 2022-10-30 DIAGNOSIS — Z1159 Encounter for screening for other viral diseases: Secondary | ICD-10-CM

## 2022-10-30 DIAGNOSIS — F339 Major depressive disorder, recurrent, unspecified: Secondary | ICD-10-CM | POA: Diagnosis not present

## 2022-10-30 DIAGNOSIS — Z0000 Encounter for general adult medical examination without abnormal findings: Secondary | ICD-10-CM

## 2022-10-30 NOTE — Assessment & Plan Note (Addendum)
Resolved since having IUD removed.

## 2022-10-30 NOTE — Progress Notes (Signed)
BP 108/74   Pulse 86   Temp 98.4 F (36.9 C) (Oral)   Wt 182 lb (82.6 kg)   LMP 10/23/2022 (Exact Date)   SpO2 98%   BMI 29.38 kg/m    Subjective:    Patient ID: Jean Gay, female    DOB: [birth year], 31 y.o.   MRN: [id number]  HPI: Jean Gay is a 32 y.o. female presenting on 10/30/2022 for comprehensive medical examination. Current medical complaints include:none  She currently lives with: Menopausal Symptoms: no  Depression Screen done today and results listed below:     10/30/2022    1:48 PM 07/19/2022    3:33 PM 06/18/2022    4:12 PM 03/21/2022    4:06 PM 02/19/2022    4:04 PM  Depression screen PHQ 2/9  Decreased Interest 0 0 1 0 1  Down, Depressed, Hopeless 0 0 1 0 1  PHQ - 2 Score 0 0 2 0 2  Altered sleeping 2 3 2 2 3  $ Tired, decreased energy 2 0 2 1 2  $ Change in appetite 0 1 0 1 0  Feeling bad or failure about yourself  0 0 0 0 1  Trouble concentrating 0 0 2 0 3  Moving slowly or fidgety/restless 0 0 0 0 0  Suicidal thoughts 0 0 0 0 0  PHQ-9 Score 4 4 8 4 11  $ Difficult doing work/chores Not difficult at all Not difficult at all Somewhat difficult Not difficult at all Somewhat difficult    The patient does not have a history of falls. I did complete a risk assessment for falls. A plan of care for falls was documented.   Past Medical History:  Past Medical History:  Diagnosis Date   PMDD (premenstrual dysphoric disorder)    Raynaud disease    Venomous snake bite 03/11/2017    Surgical History:  Past Surgical History:  Procedure Laterality Date   WISDOM TOOTH EXTRACTION      Medications:  Current Outpatient Medications on File Prior to Visit  Medication Sig   amLODipine (NORVASC) 5 MG tablet Take 1 tablet (5 mg total) by mouth at bedtime.   traZODone (DESYREL) 50 MG tablet Take 0.5 tablets (25 mg total) by mouth at bedtime as needed for sleep.   No current facility-administered medications on file prior to visit.    Allergies:  Allergies   Allergen Reactions   Oseltamivir     Dizzy Dizzy     Social History:  Social History   Socioeconomic History   Marital status: Significant Other    Spouse name: Terressa Koyanagi   Number of children: Not on file   Years of education: Not on file   Highest education level: Not on file  Occupational History   Not on file  Tobacco Use   Smoking status: Never    Passive exposure: Never   Smokeless tobacco: Never  Vaping Use   Vaping Use: Never used  Substance and Sexual Activity   Alcohol use: Yes    Comment: on weekends/occasions   Drug use: Never   Sexual activity: Yes    Birth control/protection: Surgical  Other Topics Concern   Not on file  Social History Narrative   Not on file   Social Determinants of Health   Financial Resource Strain: Not on file  Food Insecurity: Not on file  Transportation Needs: Not on file  Physical Activity: Not on file  Stress: Not on file  Social Connections: Not on file  Intimate Partner Violence:  Not on file   Social History   Tobacco Use  Smoking Status Never   Passive exposure: Never  Smokeless Tobacco Never   Social History   Substance and Sexual Activity  Alcohol Use Yes   Comment: on weekends/occasions    Family History:  Family History  Problem Relation Age of Onset   Hypertension Father    Rashes / Skin problems Sister    Healthy Brother    Diabetes Maternal Grandfather    Hypertension Maternal Grandfather    Heart disease Maternal Grandfather    Healthy Son     Past medical history, surgical history, medications, allergies, family history and social history reviewed with patient today and changes made to appropriate areas of the chart.   Review of Systems  Eyes:  Negative for blurred vision and double vision.  Respiratory:  Negative for shortness of breath.   Cardiovascular:  Negative for chest pain, palpitations and leg swelling.  Neurological:  Negative for dizziness and headaches.   All other ROS negative  except what is listed above and in the HPI.      Objective:    BP 108/74   Pulse 86   Temp 98.4 F (36.9 C) (Oral)   Wt 182 lb (82.6 kg)   LMP 10/23/2022 (Exact Date)   SpO2 98%   BMI 29.38 kg/m   Wt Readings from Last 3 Encounters:  10/30/22 182 lb (82.6 kg)  10/08/22 186 lb 6.4 oz (84.6 kg)  09/28/22 184 lb 12.8 oz (83.8 kg)    Physical Exam Vitals and nursing note reviewed.  Constitutional:      General: She is awake. She is not in acute distress.    Appearance: Normal appearance. She is well-developed. She is not ill-appearing.  HENT:     Head: Normocephalic and atraumatic.     Right Ear: Hearing, tympanic membrane, ear canal and external ear normal. No drainage.     Left Ear: Hearing, tympanic membrane, ear canal and external ear normal. No drainage.     Nose: Nose normal.     Right Sinus: No maxillary sinus tenderness or frontal sinus tenderness.     Left Sinus: No maxillary sinus tenderness or frontal sinus tenderness.     Mouth/Throat:     Mouth: Mucous membranes are moist.     Pharynx: Oropharynx is clear. Uvula midline. No pharyngeal swelling, oropharyngeal exudate or posterior oropharyngeal erythema.  Eyes:     General: Lids are normal.        Right eye: No discharge.        Left eye: No discharge.     Extraocular Movements: Extraocular movements intact.     Conjunctiva/sclera: Conjunctivae normal.     Pupils: Pupils are equal, round, and reactive to light.     Visual Fields: Right eye visual fields normal and left eye visual fields normal.  Neck:     Thyroid: No thyromegaly.     Vascular: No carotid bruit.     Trachea: Trachea normal.  Cardiovascular:     Rate and Rhythm: Normal rate and regular rhythm.     Heart sounds: Normal heart sounds. No murmur heard.    No gallop.  Pulmonary:     Effort: Pulmonary effort is normal. No accessory muscle usage or respiratory distress.     Breath sounds: Normal breath sounds.  Chest:  Breasts:    Right: Normal.      Left: Normal.  Abdominal:     General: Bowel sounds are normal.  Palpations: Abdomen is soft. There is no hepatomegaly or splenomegaly.     Tenderness: There is no abdominal tenderness.  Musculoskeletal:        General: Normal range of motion.     Cervical back: Normal range of motion and neck supple.     Right lower leg: No edema.     Left lower leg: No edema.  Lymphadenopathy:     Head:     Right side of head: No submental, submandibular, tonsillar, preauricular or posterior auricular adenopathy.     Left side of head: No submental, submandibular, tonsillar, preauricular or posterior auricular adenopathy.     Cervical: No cervical adenopathy.     Upper Body:     Right upper body: No supraclavicular, axillary or pectoral adenopathy.     Left upper body: No supraclavicular, axillary or pectoral adenopathy.  Skin:    General: Skin is warm and dry.     Capillary Refill: Capillary refill takes less than 2 seconds.     Findings: No rash.  Neurological:     Mental Status: She is alert and oriented to person, place, and time.     Gait: Gait is intact.  Psychiatric:        Attention and Perception: Attention normal.        Mood and Affect: Mood normal.        Speech: Speech normal.        Behavior: Behavior normal. Behavior is cooperative.        Thought Content: Thought content normal.        Judgment: Judgment normal.     Results for orders placed or performed in visit on [id number]  Cyclic citrul peptide antibody, IgG  Result Value Ref Range   Cyclic Citrullin Peptide Ab <16 UNITS  C3 and C4  Result Value Ref Range   C3 Complement 146 83 - 193 mg/dL   C4 Complement 27 15 - 57 mg/dL  Beta-2 glycoprotein antibodies  Result Value Ref Range   Beta-2 Glyco I IgG <2.0 <20.0 U/mL   Beta-2 Glyco 1 IgM <2.0 <20.0 U/mL   Beta-2 Glyco 1 IgA <2.0 <20.0 U/mL  Cardiolipin antibodies, IgG, IgM, IgA  Result Value Ref Range   Anticardiolipin IgA <2.0 <20.0 APL-U/mL    Anticardiolipin IgG <2.0 <20.0 GPL-U/mL   Anticardiolipin IgM <2.0 <20.0 MPL-U/mL  Lupus Anticoagulant Eval w/Reflex  Result Value Ref Range   Lupus Anticoagulant see note (A)    PTT-LA Screen 41 (H) <=40 sec   dRVVT 31 <=45 sec  Angiotensin converting enzyme  Result Value Ref Range   Angiotensin-Converting Enzyme 18 9 - 67 U/L  Cryoglobulin  Result Value Ref Range   Cryoglobulin, Qualitative Analysis None Detected None Detected  Pan-ANCA  Result Value Ref Range   ANCA SCREEN Negative Negative   Myeloperoxidase Abs <1.0 <1.0 AI   Serine Protease 3 <1.0 <1.0 AI  Hepatitis B core antibody, IgM  Result Value Ref Range   Hep B C IgM NON-REACTIVE NON-REACTIVE  Hepatitis B surface antigen  Result Value Ref Range   Hepatitis B Surface Ag NON-REACTIVE NON-REACTIVE  rflx hexagonal Phase Confirm  Result Value Ref Range   Hexagonal Phase Conf Weak Positive (A) Negative  Thrombin Clotting Time  Result Value Ref Range   Thrombin Clotting Time 17 13 - 19 sec      Assessment & Plan:   Problem List Items Addressed This Visit       Other   Depression, recurrent ([place])  Resolved since having IUD removed.      Relevant Orders   Comprehensive metabolic panel   Other Visit Diagnoses     Annual physical exam    -  Primary   Health maintenance reviewed during visit today.  Labs ordered.  PAP up to date.   Relevant Orders   CBC with Differential/Platelet   Comprehensive metabolic panel   Lipid panel   TSH   Urinalysis, Routine w reflex microscopic   Encounter for hepatitis C screening test for low risk patient       Relevant Orders   Hepatitis C Antibody        Follow up plan: Return in about 1 year (around 10/31/2023) for Physical and Fasting labs.   LABORATORY TESTING:  - Pap smear: up to date  IMMUNIZATIONS:   - Tdap: Tetanus vaccination status reviewed: last tetanus booster within 10 years. - Influenza: Refused - Pneumovax: Not applicable - Prevnar: Not  applicable - COVID: Not applicable - HPV: Not applicable - Shingrix vaccine: Not applicable  SCREENING: -Mammogram: Not applicable  - Colonoscopy: Not applicable  - Bone Density: Not applicable  -Hearing Test: Not applicable  -Spirometry: Not applicable   PATIENT COUNSELING:   Advised to take 1 mg of folate supplement per day if capable of pregnancy.   Sexuality: Discussed sexually transmitted diseases, partner selection, use of condoms, avoidance of unintended pregnancy  and contraceptive alternatives.   Advised to avoid cigarette smoking.  I discussed with the patient that most people either abstain from alcohol or drink within safe limits (<=14/week and <=4 drinks/occasion for males, <=7/weeks and <= 3 drinks/occasion for females) and that the risk for alcohol disorders and other health effects rises proportionally with the number of drinks per week and how often a drinker exceeds daily limits.  Discussed cessation/primary prevention of drug use and availability of treatment for abuse.   Diet: Encouraged to adjust caloric intake to maintain  or achieve ideal body weight, to reduce intake of dietary saturated fat and total fat, to limit sodium intake by avoiding high sodium foods and not adding table salt, and to maintain adequate dietary potassium and calcium preferably from fresh fruits, vegetables, and low-fat dairy products.    stressed the importance of regular exercise  Injury prevention: Discussed safety belts, safety helmets, smoke detector, smoking near bedding or upholstery.   Dental health: Discussed importance of regular tooth brushing, flossing, and dental visits.    NEXT PREVENTATIVE PHYSICAL DUE IN 1 YEAR. Return in about 1 year (around 10/31/2023) for Physical and Fasting labs.

## 2022-10-31 LAB — LIPID PANEL
Chol/HDL Ratio: 2.6 ratio (ref 0.0–4.4)
Cholesterol, Total: 193 mg/dL (ref 100–199)
HDL: 73 mg/dL (ref >39)
LDL Chol Calc (NIH): 105 mg/dL — ABNORMAL HIGH (ref 0–99)
Triglycerides: 83 mg/dL (ref 0–149)
VLDL Cholesterol Cal: 15 mg/dL (ref 5–40)

## 2022-10-31 LAB — CBC WITH DIFFERENTIAL/PLATELET
Basophils Absolute: 0 10*3/uL (ref 0.0–0.2)
Basos: 0 %
EOS (ABSOLUTE): 0 10*3/uL (ref 0.0–0.4)
Eos: 1 %
Hematocrit: 40.6 % (ref 34.0–46.6)
Hemoglobin: 14.6 g/dL (ref 11.1–15.9)
Immature Grans (Abs): 0 10*3/uL (ref 0.0–0.1)
Immature Granulocytes: 0 %
Lymphocytes Absolute: 2.4 10*3/uL (ref 0.7–3.1)
Lymphs: 41 %
MCH: 32.4 pg (ref 26.6–33.0)
MCHC: 36 g/dL — ABNORMAL HIGH (ref 31.5–35.7)
MCV: 90 fL (ref 79–97)
Monocytes Absolute: 0.4 10*3/uL (ref 0.1–0.9)
Monocytes: 7 %
Neutrophils Absolute: 3.1 10*3/uL (ref 1.4–7.0)
Neutrophils: 51 %
Platelets: 319 10*3/uL (ref 150–450)
RBC: 4.51 x10E6/uL (ref 3.77–5.28)
RDW: 11.8 % (ref 11.7–15.4)
WBC: 6 10*3/uL (ref 3.4–10.8)

## 2022-10-31 LAB — COMPREHENSIVE METABOLIC PANEL
ALT: 20 IU/L (ref 0–32)
AST: 18 IU/L (ref 0–40)
Albumin/Globulin Ratio: 1.9 (ref 1.2–2.2)
Albumin: 5.4 g/dL — ABNORMAL HIGH (ref 4.0–5.0)
Alkaline Phosphatase: 90 IU/L (ref 44–121)
BUN/Creatinine Ratio: 8 — ABNORMAL LOW (ref 9–23)
BUN: 9 mg/dL (ref 6–20)
Bilirubin Total: 0.5 mg/dL (ref 0.0–1.2)
CO2: 23 mmol/L (ref 20–29)
Calcium: 10.2 mg/dL (ref 8.7–10.2)
Chloride: 99 mmol/L (ref 96–106)
Creatinine, Ser: 1.06 mg/dL — ABNORMAL HIGH (ref 0.57–1.00)
Globulin, Total: 2.8 g/dL (ref 1.5–4.5)
Glucose: 90 mg/dL (ref 70–99)
Potassium: 4.1 mmol/L (ref 3.5–5.2)
Sodium: 138 mmol/L (ref 134–144)
Total Protein: 8.2 g/dL (ref 6.0–8.5)
eGFR: 72 mL/min/{1.73_m2} (ref >59)

## 2022-10-31 LAB — URINALYSIS, ROUTINE W REFLEX MICROSCOPIC
Bilirubin, UA: NEGATIVE
Glucose, UA: NEGATIVE
Ketones, UA: NEGATIVE
Leukocytes,UA: NEGATIVE
Nitrite, UA: NEGATIVE
Protein,UA: NEGATIVE
RBC, UA: NEGATIVE
Specific Gravity, UA: 1.006 (ref 1.005–1.030)
Urobilinogen, Ur: 0.2 mg/dL (ref 0.2–1.0)
pH, UA: 6 (ref 5.0–7.5)

## 2022-10-31 LAB — HEPATITIS C ANTIBODY: Hep C Virus Ab: NONREACTIVE

## 2022-10-31 LAB — TSH: TSH: 2.15 u[IU]/mL (ref 0.450–4.500)

## 2022-10-31 NOTE — Progress Notes (Signed)
Hi Jean Gay. It was nice to see you yesterday.  Your lab work looks good.  Make sure you are drinking plenty of water.  No concerns at this time. Continue with your current medication regimen.  Follow up as discussed.  Please let me know if you have any questions.

## 2023-01-23 NOTE — Progress Notes (Deleted)
Office Visit Note  Patient: Jean Gay             Date of Birth: Apr 20, 1991           MRN: [id number]             PCP: Larae Grooms, NP Referring: Larae Grooms, NP Visit Date: 02/06/2023 Occupation: @GUAROCC @  Subjective:    History of Present Illness: [name] is a 32 y.o. female with history of raynaud's disease.  She remains on amlodipine 5 mg 1 tablet by mouth daily.  History of [name] for 10 years which is progressively getting worse.  There is no history of digital ulcers.  She had decreased capillary refill without any nailbed capillary changes or sclerodactyly.   09/18/22: Lupus anticoagulant positive, anticardiolipin negative, beta-2 GP 1 negative, ANCA negative, MPO negative, serum proteinase 3 negative, C3-C4 normal, anti-CCP negative, ACE 18, cryoglobulins negative, hepatitis B negative.  Lupus anticoagulant will be rechecked today.    Activities of Daily Living:  Patient reports morning stiffness for *** {minute/hour:19697}.   Patient {ACTIONS;DENIES/REPORTS:21021675::"Denies"} nocturnal pain.  Difficulty dressing/grooming: {ACTIONS;DENIES/REPORTS:21021675::"Denies"} Difficulty climbing stairs: {ACTIONS;DENIES/REPORTS:21021675::"Denies"} Difficulty getting out of chair: {ACTIONS;DENIES/REPORTS:21021675::"Denies"} Difficulty using hands for taps, buttons, cutlery, and/or writing: {ACTIONS;DENIES/REPORTS:21021675::"Denies"}  No Rheumatology ROS completed.   PMFS History:  Patient Active Problem List   Diagnosis Date Noted   Depression, recurrent (HCC) 10/16/2021   Anxiety 10/16/2021   Encounter for care or examination of lactating mother 08/31/2020   Postpartum care following vaginal delivery 08/31/2020   [redacted] weeks gestation of pregnancy 08/29/2020   Labor without complication 08/29/2020   Menorrhagia 02/03/2020   PMDD (premenstrual dysphoric disorder) 02/03/2020   Supervision of other normal pregnancy, antepartum 01/27/2020    Past Medical  History:  Diagnosis Date   PMDD (premenstrual dysphoric disorder)    Raynaud disease    Venomous snake bite 03/11/2017    Family History  Problem Relation Age of Onset   Hypertension Father    Rashes / Skin problems Sister    Healthy Brother    Diabetes Maternal Grandfather    Hypertension Maternal Grandfather    Heart disease Maternal Grandfather    Healthy Son    Past Surgical History:  Procedure Laterality Date   WISDOM TOOTH EXTRACTION     Social History   Social History Narrative   Not on file   Immunization History  Administered Date(s) Administered   Influenza,inj,Quad PF,6+ Mos 10/16/2021   Influenza-Unspecified 06/07/2011, 07/09/2012   MMR 08/31/2020   Moderna Sars-Covid-2 Vaccination 05/27/2020, 06/24/2020   PPD Test 09/22/2012   Tdap 08/12/2012, 07/05/2020     Objective: Vital Signs: There were no vitals taken for this visit.   Physical Exam Vitals and nursing note reviewed.  Constitutional:      Appearance: She is well-developed.  HENT:     Head: Normocephalic and atraumatic.  Eyes:     Conjunctiva/sclera: Conjunctivae normal.  Cardiovascular:     Rate and Rhythm: Normal rate and regular rhythm.     Heart sounds: Normal heart sounds.  Pulmonary:     Effort: Pulmonary effort is normal.     Breath sounds: Normal breath sounds.  Abdominal:     General: Bowel sounds are normal.     Palpations: Abdomen is soft.  Musculoskeletal:     Cervical back: Normal range of motion.  Lymphadenopathy:     Cervical: No cervical adenopathy.  Skin:    General: Skin is warm and dry.     Capillary Refill: Capillary refill  takes less than 2 seconds.  Neurological:     Mental Status: She is alert and oriented to person, place, and time.  Psychiatric:        Behavior: Behavior normal.     Musculoskeletal Exam: ***  CDAI Exam: CDAI Score: -- Patient Global: --; Provider Global: -- Swollen: --; Tender: -- Joint Exam 02/06/2023   No joint exam has been  documented for this visit   There is currently no information documented on the homunculus. Go to the Rheumatology activity and complete the homunculus joint exam.  Investigation: No additional findings.  Imaging: No results found.  Recent Labs: Lab Results  Component Value Date   WBC 6.0 10/30/2022   HGB 14.6 10/30/2022   PLT 319 10/30/2022   NA 138 10/30/2022   K 4.1 10/30/2022   CL 99 10/30/2022   CO2 23 10/30/2022   GLUCOSE 90 10/30/2022   BUN 9 10/30/2022   CREATININE 1.06 (H) 10/30/2022   BILITOT 0.5 10/30/2022   ALKPHOS 90 10/30/2022   AST 18 10/30/2022   ALT 20 10/30/2022   PROT 8.2 10/30/2022   ALBUMIN 5.4 (H) 10/30/2022   CALCIUM 10.2 10/30/2022   GFRAA >60 02/08/2020    Speciality Comments: No specialty comments available.  Procedures:  No procedures performed Allergies: Oseltamivir   Assessment / Plan:     Visit Diagnoses: Raynaud's disease without gangrene  Lupus anticoagulant positive  Rheumatoid factor positive  Hypermobility of joint  Rash  Other fatigue  Anxiety and depression  PMDD (premenstrual dysphoric disorder)  Orders: No orders of the defined types were placed in this encounter.  No orders of the defined types were placed in this encounter.   Face-to-face time spent with patient was *** minutes. Greater than 50% of time was spent in counseling and coordination of care.  Follow-Up Instructions: No follow-ups on file.   Gearldine Bienenstock, PA-C  Note - This record has been created using Dragon software.  Chart creation errors have been sought, but may not always  have been located. Such creation errors do not reflect on  the standard of medical care.

## 2023-02-06 ENCOUNTER — Ambulatory Visit: Payer: 59 | Admitting: Physician Assistant

## 2023-02-06 DIAGNOSIS — I7300 Raynaud's syndrome without gangrene: Secondary | ICD-10-CM

## 2023-02-06 DIAGNOSIS — F3281 Premenstrual dysphoric disorder: Secondary | ICD-10-CM

## 2023-02-06 DIAGNOSIS — R5383 Other fatigue: Secondary | ICD-10-CM

## 2023-02-06 DIAGNOSIS — M249 Joint derangement, unspecified: Secondary | ICD-10-CM

## 2023-02-06 DIAGNOSIS — R21 Rash and other nonspecific skin eruption: Secondary | ICD-10-CM

## 2023-02-06 DIAGNOSIS — R768 Other specified abnormal immunological findings in serum: Secondary | ICD-10-CM

## 2023-02-06 DIAGNOSIS — R760 Raised antibody titer: Secondary | ICD-10-CM

## 2023-02-06 DIAGNOSIS — F32A Depression, unspecified: Secondary | ICD-10-CM

## 2023-07-13 ENCOUNTER — Other Ambulatory Visit: Payer: Self-pay | Admitting: Nurse Practitioner

## 2023-07-16 NOTE — Telephone Encounter (Signed)
Per OV of 10/30/2022 pt is to rtc in 1 year. Requested Prescriptions  Pending Prescriptions Disp Refills   traZODone (DESYREL) 50 MG tablet [Pharmacy Med Name: TRAZODONE 50 MG TABLET] 45 tablet 3    Sig: TAKE 0.5 TABLETS BY MOUTH AT BEDTIME AS NEEDED FOR SLEEP.     Psychiatry: Antidepressants - Serotonin Modulator Failed - 07/13/2023  9:22 AM      Failed - Valid encounter within last 6 months    Recent Outpatient Visits           8 months ago Annual physical exam   [place] [facility] Larae Grooms, NP   9 months ago Congestion of upper airway   [place] [facility] Larae Grooms, NP   12 months ago Depression, recurrent [facility])   [place] [facility] Larae Grooms, NP   1 year ago Anxiety   [place] [facility] Larae Grooms, NP   1 year ago Depression, recurrent [facility])   [place] [facility] Larae Grooms, NP       Future Appointments             In 3 months Larae Grooms, NP [place] [facility]            Passed - Completed PHQ-2 or PHQ-9 in the last 360 days

## 2023-08-30 ENCOUNTER — Ambulatory Visit: Payer: 59 | Admitting: Pediatrics

## 2023-08-30 ENCOUNTER — Encounter: Payer: Self-pay | Admitting: Pediatrics

## 2023-08-30 VITALS — BP 118/79 | HR 75 | Temp 98.6°F | Resp 16 | Wt 191.4 lb

## 2023-08-30 DIAGNOSIS — F3281 Premenstrual dysphoric disorder: Secondary | ICD-10-CM

## 2023-08-30 DIAGNOSIS — F411 Generalized anxiety disorder: Secondary | ICD-10-CM

## 2023-08-30 DIAGNOSIS — Z1330 Encounter for screening examination for mental health and behavioral disorders, unspecified: Secondary | ICD-10-CM | POA: Diagnosis not present

## 2023-08-30 DIAGNOSIS — F331 Major depressive disorder, recurrent, moderate: Secondary | ICD-10-CM | POA: Insufficient documentation

## 2023-08-30 MED ORDER — ESCITALOPRAM OXALATE 10 MG PO TABS: 10.0000 mg | ORAL_TABLET | Freq: Every day | ORAL | 2 refills | Status: DC

## 2023-08-30 MED ORDER — TRAZODONE HCL 50 MG PO TABS: 25.0000 mg | ORAL_TABLET | Freq: Every evening | ORAL | 3 refills | Status: DC | PRN

## 2023-08-30 NOTE — Patient Instructions (Addendum)
Take 1/2 tab for 7 days and then take the full tab Continue trazadone 25-50mg   [email or web address]  [email or web address]   [email or web address]

## 2023-08-30 NOTE — Assessment & Plan Note (Deleted)
Severe anxiety with constant worry about possible outcomes, feeling of dread, and difficulty shutting off thoughts. Reports feeling "blah" and not sad. History of PMDD diagnosis. History of ADHD diagnosis in childhood, currently experiencing significant concentration issues affecting work and personal life. Previous trial of Wellbutrin and Zoloft, with Zoloft causing loss of sex drive. Tried prozac in adolescence. I suspect underlying ADHD contributing to heightened mood symptoms. It is unclear at this time if comborbid MDD and anxiety are driving concentration but will further characterize at the ADHD evaluation next week. Recommend she establishes with a therapist as well. No safety concerns at this time. -Start Lexapro, beginning with 5mg  for 7 days then increasing to 10mg  daily. -Continue Trazodone for sleep, with flexibility to increase dose if needed. -Refer to Cognitive Behavioral Therapy (CBT) for additional support.

## 2023-08-30 NOTE — Assessment & Plan Note (Signed)
Severe anxiety with constant worry about possible outcomes, feeling of dread, and difficulty shutting off thoughts. Reports feeling "blah" and not sad. History of PMDD diagnosis. History of ADHD diagnosis in childhood, currently experiencing significant concentration issues affecting work and personal life. Previous trial of Wellbutrin and Zoloft, with Zoloft causing loss of sex drive. Tried prozac in adolescence. I suspect underlying ADHD contributing to heightened mood symptoms. It is unclear at this time if comborbid MDD and anxiety are driving concentration but will further characterize at the ADHD evaluation next week. Recommend she establishes with a therapist as well. No safety concerns at this time. -Start Lexapro, beginning with 5mg  for 7 days then increasing to 10mg  daily. -Continue Trazodone for sleep, with flexibility to increase dose if needed. -Refer to Cognitive Behavioral Therapy (CBT) for additional support.

## 2023-08-30 NOTE — Progress Notes (Signed)
Office Visit  BP 118/79 (BP Location: Left Arm, Patient Position: Sitting, Cuff Size: Normal)   Pulse 75   Temp 98.6 F (37 C) (Oral)   Resp 16   Wt 191 lb 6.4 oz (86.8 kg)   LMP 08/25/2023 (Exact Date)   SpO2 98%   BMI 30.89 kg/m    Subjective:    Patient ID: Jean Gay, female    DOB: 01/23/1991, 32 y.o.   MRN: [id number]  HPI: Jean Gay is a 32 y.o. female  Chief Complaint  Patient presents with   Anxious    Ongoing for a long time but feels this year things have gotten much worse. Feels anxious, worst case scenarios, over stimulated, not sleeping, does not feel its sadness, can't focus, zero motivation. Everything feels like a mountain she has to climb.    Headache    Feels this is a later symptom. 3-4 times a week. Some light sensitively. Wonders if lack of sleep related. Past history with headaches and feels they get worse with stress. Waking up or going to bed with headaches.     Discussed the use of AI scribe software for clinical note transcription with the patient, who gave verbal consent to proceed.  History of Present Illness   The patient, with a history of anxiety, depression, and attention deficit hyperactivity disorder (ADHD), presents with worsening anxiety symptoms. She describes an increase in worry about potential outcomes of situations, leading to distress and avoidance of daily tasks. The patient also reports feeling "blah" and easily distracted, which is affecting her relationships, particularly with her spouse and children. She describes quick mood changes, from happiness to irritation, and periods of quietness where she feels "blah" but not sad.  The patient's sleep is disturbed due to an inability to "shut off" her brain, leading to constant thoughts and worries. She currently takes trazodone for sleep and supplements with melatonin, but still reports waking up during the night.  The patient has a history of ADHD, diagnosed in childhood but never  medicated. She reports always having struggled with concentration, which is now affecting her work and personal relationships. She describes difficulty completing tasks at work and telling stories or explaining situations to her spouse and children due to frequent distraction.  The patient also has a history of depression, particularly during her teenage years, which was associated with her parents' divorce. She reports having stopped eating almost entirely during high school, leading to concerns about an eating disorder. In her early twenties, she was diagnosed with premenstrual dysphoric disorder (PMDD) and treated with Prozac, which she stopped taking due to feelings of being better off not being here.  The patient has previously been treated with Wellbutrin for her ADHD, which she stopped taking due to feeling "okay" on it. She has also been prescribed Zoloft in the past, which she stopped due to a loss of sex drive. The patient expresses a desire for treatment and concern about the impact of her symptoms on her life and relationships.      Relevant past medical, surgical, family and social history reviewed and updated as indicated. Interim medical history since our last visit reviewed. Allergies and medications reviewed and updated.  ROS per HPI unless specifically indicated above     Objective:    BP 118/79 (BP Location: Left Arm, Patient Position: Sitting, Cuff Size: Normal)   Pulse 75   Temp 98.6 F (37 C) (Oral)   Resp 16   Wt 191 lb 6.4 oz (  86.8 kg)   LMP 08/25/2023 (Exact Date)   SpO2 98%   BMI 30.89 kg/m   Wt Readings from Last 3 Encounters:  08/30/23 191 lb 6.4 oz (86.8 kg)  10/30/22 182 lb (82.6 kg)  10/08/22 186 lb 6.4 oz (84.6 kg)     Physical Exam Constitutional:      Appearance: Normal appearance.  Pulmonary:     Effort: Pulmonary effort is normal.  Musculoskeletal:        General: Normal range of motion.  Skin:    Comments: Normal skin color  Neurological:      General: No focal deficit present.     Mental Status: She is alert. Mental status is at baseline.  Psychiatric:        Mood and Affect: Mood normal.        Behavior: Behavior normal.        Thought Content: Thought content normal.         08/30/2023   11:03 AM 10/30/2022    1:48 PM 07/19/2022    3:33 PM 06/18/2022    4:12 PM 03/21/2022    4:06 PM  Depression screen PHQ 2/9  Decreased Interest 3 0 0 1 0  Down, Depressed, Hopeless 0 0 0 1 0  PHQ - 2 Score 3 0 0 2 0  Altered sleeping 3 2 3 2 2   Tired, decreased energy 3 2 0 2 1  Change in appetite 2 0 1 0 1  Feeling bad or failure about yourself  0 0 0 0 0  Trouble concentrating 3 0 0 2 0  Moving slowly or fidgety/restless 3 0 0 0 0  Suicidal thoughts 0 0 0 0 0  PHQ-9 Score 17 4 4 8 4   Difficult doing work/chores Extremely dIfficult Not difficult at all Not difficult at all Somewhat difficult Not difficult at all       08/30/2023   11:03 AM 10/30/2022    1:49 PM 07/19/2022    3:34 PM 06/18/2022    4:12 PM  GAD 7 : Generalized Anxiety Score  Nervous, Anxious, on Edge 3 1 1 1   Control/stop worrying 3 1 1 1   Worry too much - different things 3 0 1 1  Trouble relaxing 3 2 3 1   Restless 3 0 0 0  Easily annoyed or irritable 3 0 0 0  Afraid - awful might happen 2 0 1 0  Total GAD 7 Score 20 4 7 4   Anxiety Difficulty Extremely difficult Somewhat difficult Not difficult at all Somewhat difficult       Assessment & Plan:  Assessment & Plan   GAD (generalized anxiety disorder) Moderate episode of recurrent major depressive disorder (HCC) PMDD (premenstrual dysphoric disorder) Assessment & Plan: Severe anxiety with constant worry about possible outcomes, feeling of dread, and difficulty shutting off thoughts. Reports feeling "blah" and not sad. History of PMDD diagnosis. History of ADHD diagnosis in childhood, currently experiencing significant concentration issues affecting work and personal life. Previous trial of Wellbutrin  and Zoloft, with Zoloft causing loss of sex drive. Tried prozac in adolescence. I suspect underlying ADHD contributing to heightened mood symptoms. It is unclear at this time if comborbid MDD and anxiety are driving concentration but will further characterize at the ADHD evaluation next week. Recommend she establishes with a therapist as well. No safety concerns at this time. ADHD evaluation planned for next week. -Start Lexapro, beginning with 5mg  for 7 days then increasing to 10mg  daily. -Continue Trazodone for  sleep, with flexibility to increase dose if needed. -Refer to Cognitive Behavioral Therapy (CBT) for additional support. -PMDD resources for self-education and support.  Orders: -     Escitalopram Oxalate; Take 1 tablet (10 mg total) by mouth daily.  Dispense: 30 tablet; Refill: 2 -     traZODone HCl; Take 0.5-1 tablets (25-50 mg total) by mouth at bedtime as needed for sleep.  Dispense: 30 tablet; Refill: 3  Encounter for behavioral health screening As part of their intake evaluation, the patient was screened for depression, anxiety.  PHQ9 SCORE 17, GAD7 SCORE 20. Screening results positive for tested conditions. See plan under problem/diagnosis above.  Follow up plan: Return in about 6 days (around 09/05/2023) for ADHD, (virtual).  Delawrence Fridman Howell Pringle, MD

## 2023-09-05 ENCOUNTER — Ambulatory Visit: Payer: 59 | Admitting: Pediatrics

## 2023-09-05 ENCOUNTER — Encounter: Payer: Self-pay | Admitting: Pediatrics

## 2023-09-05 VITALS — BP 113/76 | HR 60 | Temp 98.6°F | Resp 14 | Wt 186.8 lb

## 2023-09-05 DIAGNOSIS — Z1330 Encounter for screening examination for mental health and behavioral disorders, unspecified: Secondary | ICD-10-CM | POA: Diagnosis not present

## 2023-09-05 DIAGNOSIS — F331 Major depressive disorder, recurrent, moderate: Secondary | ICD-10-CM

## 2023-09-05 DIAGNOSIS — R41840 Attention and concentration deficit: Secondary | ICD-10-CM

## 2023-09-05 DIAGNOSIS — F411 Generalized anxiety disorder: Secondary | ICD-10-CM | POA: Diagnosis not present

## 2023-09-05 DIAGNOSIS — F909 Attention-deficit hyperactivity disorder, unspecified type: Secondary | ICD-10-CM

## 2023-09-05 DIAGNOSIS — F3281 Premenstrual dysphoric disorder: Secondary | ICD-10-CM

## 2023-09-05 MED ORDER — AMPHETAMINE-DEXTROAMPHET ER 10 MG PO CP24: 10.0000 mg | ORAL_CAPSULE | Freq: Every day | ORAL | 0 refills | Status: DC

## 2023-09-05 NOTE — Progress Notes (Signed)
Office Visit - ADHD Evaluation  BP 113/76 (BP Location: Left Arm, Patient Position: Sitting, Cuff Size: Normal)   Pulse 60   Temp 98.6 F (37 C) (Oral)   Resp 14   Wt 186 lb 12.8 oz (84.7 kg)   LMP 08/25/2023 (Exact Date)   SpO2 98%   BMI 30.15 kg/m    Subjective:    Patient ID: Jean Gay, female    DOB: 1990/10/21, 32 y.o.   MRN: [id number]  HPI: Jean Gay is a 32 y.o. female  Chief Complaint  Patient presents with   ADHD   Anxiety    Not constant, comes and goes. Overstimulation feels improvement from where she was. Still there but takes a bit more for her to react   SUBJECTIVE  Patient presents today with a complaint of poor concentration/focus. She reports that she has noted some improvement, sleeping better. Tolerating lexapro well.  She reports that symptoms began in childhood but have been worsening in the last few months.  She was been diagnosed with ADHD previously, in childhood.  She reports that her problem with concentration affects the following functional areas: work and personal life (relationships with others). She notices the symptoms She endorses the following symptoms:   Hyperactivity(6/9 for children, 5/9 for adults)  [x]  Often fidgets with or taps hands or feet or squirms in seat  []  Difficulty remaining seated when sitting is required (eg, at school, work, etc)  [x]  Feelings of restlessness (in adolescents/adults) or inappropriate running around or climbing in younger children  [x]  Difficulty playing or doing leisure activities quietly  [x]  Difficult to keep up with, seeming to always be "on the go" (may be experienced by others as being restless or difficult to keep up with)  [x]  Excessive talking  [x]  Difficulty waiting turns (while in line for example)  [x]  Blurting out answers too quickly (completes people' sentences; cannot wait for turn in conversation)  [x]  Interruption or intrusion of others (may intrude into or take over what others are  doing)  Inattention (6/9 for children, 5/9 for adults) [x]  Failure to provide close attention to detail, careless mistakes [x]  Difficulty maintaining attention in play, school, or home activities (difficulty remaining focused during lectures, conversations, or lengthy reading) [x]  Seems not to listen, even when directly addressed [x]  Fails to follow through  (ex: homework, chores, workplace duties, misses deadlines)- difficulty especially at work with this and supervisor [x]  Difficulty organizing tasks, activities, and belongings (difficulty managing sequential tasks) [x]  Avoids tasks that require consistent mental effort (preparing reports, completing forms, reviewing lengthy papers) [x]  Loses objects required for tasks or activities (eg, school books, sports equipment, phones, etc) [x]  Easily distracted by irrelevant stimuli (maybe unrelated thoughts) [x]  Forgetfulness in routine activities (eg, homework, chores, paying bills, keeping appointments, etc)  Work dayoverview: - out in [employer] a lot, sometimes drives for prolonged amount - meet with homeowners, 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) - has laptop work throughout the day - variable work setting (home vs out in the field)   In regards to mood the patient reports:  - Teacher, English as a foreign language, she felt had a severe day, - felt severe dread, sorrow,  - no SI/HI - PMDD window severe - 5-7days before menses; sometimes does not have PMDD symptoms every month  In regards to anxiety the patient reports: - feels a little bit less anxious, overall the same as last week  Social History:  - 1 step daughter, 1 son - 6 and 3  years old son - Home w husband - has good support system with her friends - works as Merchandiser, 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 History of ADHD diagnosis in childhood,  currently experiencing significant concentration issues affecting work and personal life. Previous trial of Wellbutrin and Zoloft, with Zoloft causing loss of sex drive. Tried prozac in adolescence.  -? Eating disorders, never admitted but recommended for group counseling in teenage years - no SI/HI, no prior attempts, hopspitalizations - guns in the home, locked way - had bad PP depression  Substance Use:  - no prior history - social drinker a couple times a month  Cardiac History:  Uncontrolled HTN: no Arrhythmias: no Structural heart disease: no  Review of Systems:  Card: no palpitations Neuro: no insomnia or headaches Psychiatric: no worsening anxiety GI: no stomach aches  Relevant past medical, surgical, family and social history reviewed and updated as indicated. Interim medical history since our last visit reviewed. Allergies and medications reviewed and updated.  ROS per HPI unless specifically indicated above     Objective:    BP 113/76 (BP Location: Left Arm, Patient Position: Sitting, Cuff Size: Normal)   Pulse 60   Temp 98.6 F (37 C) (Oral)   Resp 14   Wt 186 lb 12.8 oz (84.7 kg)   LMP 08/25/2023 (Exact Date)   SpO2 98%   BMI 30.15 kg/m   Wt Readings from Last 3 Encounters:  09/05/23 186 lb 12.8 oz (84.7 kg)  08/30/23 191 lb 6.4 oz (86.8 kg)  10/30/22 182 lb (82.6 kg)     Physical Exam Constitutional:      Appearance: Normal appearance.  Pulmonary:     Effort: Pulmonary effort is normal.  Musculoskeletal:        General: Normal range of motion.  Skin:    Comments: Normal skin color  Neurological:     General: No focal deficit present.     Mental Status: She is alert. Mental status is at baseline.  Psychiatric:        Mood and Affect: Mood normal.        Behavior: Behavior normal.        Thought Content: Thought content normal.         09/05/2023    1:29 PM 08/30/2023   11:03 AM 10/30/2022    1:48 PM 07/19/2022    3:33 PM 06/18/2022     4:12 PM  Depression screen PHQ 2/9  Decreased Interest 3 3 0 0 1  Down, Depressed, Hopeless 1 0 0 0 1  PHQ - 2 Score 4 3 0 0 2  Altered sleeping 3 3 2 3 2   Tired, decreased energy 3 3 2  0 2  Change in appetite 1 2 0 1 0  Feeling bad or failure about yourself  0 0 0 0 0  Trouble concentrating 3 3 0 0 2  Moving slowly or fidgety/restless 1 3 0 0 0  Suicidal thoughts 0 0 0 0 0  PHQ-9 Score 15 17 4 4 8   Difficult doing work/chores Extremely dIfficult Extremely dIfficult Not difficult at all Not difficult at all Somewhat difficult       09/05/2023    1:29 PM 08/30/2023   11:03 AM 10/30/2022    1:49 PM 07/19/2022    3:34 PM  GAD 7 : Generalized Anxiety Score  Nervous, Anxious,  on Edge 3 3 1 1   Control/stop worrying 3 3 1 1   Worry too much - different things 3 3 0 1  Trouble relaxing 3 3 2 3   Restless 3 3 0 0  Easily annoyed or irritable 3 3 0 0  Afraid - awful might happen 3 2 0 1  Total GAD 7 Score 21 20 4 7   Anxiety Difficulty Extremely difficult Extremely difficult Somewhat difficult Not difficult at all       Assessment & Plan:  Assessment & Plan   Adult ADHD Assessment & Plan: Completed full ADHD evaluation today. Previously diagnosed in childhood. Confirmed today has mixed type/symptoms (hyperactive and inattentive) on self questionnaire and evaluation administered by me. We did discuss that underlying GAD/MDD may also be contributing to attention deficits. I suspect treatment of ADHD may help her other comorbid psychiatric conditions. Will start with long acting stimulant as below based on work demands.  Orders: -     Amphetamine-Dextroamphet ER; Take 1 capsule (10 mg total) by mouth daily.  Dispense: 30 capsule; Refill: 0  GAD (generalized anxiety disorder) PMDD (premenstrual dysphoric disorder) Moderate episode of recurrent major depressive disorder (HCC) Assessment & Plan: Continue lexapro 10mg  and trazodone as needed. Continue titration as stimulant is started.  Encouraged to establish with therapist. Safety precautions reviewed given past psychiatric history. No acute safety concerns at this time.  Encounter for behavioral health screening As part of their intake evaluation, the patient was screened for depression, anxiety.  PHQ9 SCORE 15, GAD7 SCORE 21. Screening results positive for tested conditions. See plan under problem/diagnosis above.   Follow up plan: Return in about 15 days (around 09/20/2023) for ADHD.  Jean Accomando Howell Pringle, MD

## 2023-09-05 NOTE — Patient Instructions (Addendum)
Lexapro daily 10mg  Start adderral long acting 10mg  XR 4-5 days a week depending on your work schedule

## 2023-09-06 ENCOUNTER — Encounter: Payer: Self-pay | Admitting: Pediatrics

## 2023-09-06 DIAGNOSIS — F909 Attention-deficit hyperactivity disorder, unspecified type: Secondary | ICD-10-CM | POA: Insufficient documentation

## 2023-09-06 NOTE — Assessment & Plan Note (Signed)
Completed full ADHD evaluation today. Previously diagnosed in childhood. Confirmed today has mixed type/symptoms. We did discuss that underlying GAD/MDD may also be contributing to attention deficits. I suspect treatment of ADHD may help her other comorbid psychiatric conditions. Will start with long acting stimulant as below based on work demands.

## 2023-09-06 NOTE — Assessment & Plan Note (Signed)
Continue lexapro 10mg  and trazodone as needed. Continue titration as stimulant is started. Encouraged to establish with therapist. Safety precautions reviewed given past psychiatric history. No acute safety concerns at this time.

## 2023-09-16 ENCOUNTER — Ambulatory Visit: Payer: Self-pay | Admitting: *Deleted

## 2023-09-16 NOTE — Telephone Encounter (Signed)
Summary: barking cough, eye infection   Croupy sounding barking cough, eye discharge and pink in both eyes         Chief Complaint: Cough, eye drainage Symptoms: Barky cough, SOB at rest,chest pressure, eye drainage, greenish, eyes swollen,wheezing Frequency: Cough 5 days, eyes 1-2 days Pertinent Negatives: Patient denies  Disposition: [] ED /[x] Urgent Care (no appt availability in office) / [] Appointment(In office/virtual)/ []  [facility]/ [] Home Care/ [] Refused Recommended Disposition /[] [facility]/ []  Follow-up with PCP Additional Notes: No availability at practice, advised UC. Care advise provided.  Reason for Disposition  Wheezing is present  Answer Assessment - Initial Assessment Questions 1. ONSET: "When did the cough begin?"      5 days ago 2. SEVERITY: "How bad is the cough today?"      Bad spells 3. SPUTUM: "Describe the color of your sputum" (none, dry cough; clear, white, yellow, green)     None 4. HEMOPTYSIS: "Are you coughing up any blood?" If so ask: "How much?" (flecks, streaks, tablespoons, etc.)     No 5. DIFFICULTY BREATHING: "Are you having difficulty breathing?" If Yes, ask: "How bad is it?" (e.g., mild, moderate, severe)    - MILD: No SOB at rest, mild SOB with walking, speaks normally in sentences, can lie down, no retractions, pulse < 100.    - MODERATE: SOB at rest, SOB with minimal exertion and prefers to sit, cannot lie down flat, speaks in phrases, mild retractions, audible wheezing, pulse 100-120.    - SEVERE: Very SOB at rest, speaks in single words, struggling to breathe, sitting hunched forward, retractions, pulse > 120      At rest 6. FEVER: "Do you have a fever?" If Yes, ask: "What is your temperature, how was it measured, and when did it start?"     Fever, "Broke yesterday" 10. OTHER SYMPTOMS: "Do you have any other symptoms?" (e.g., runny nose, wheezing, chest pain)       Chest heavy,fatigued, barky cough, green eye  drainage, both eyes swollen  Protocols used: Cough - Acute Productive-A-AH

## 2023-09-20 ENCOUNTER — Ambulatory Visit: Payer: 59 | Admitting: Pediatrics

## 2023-09-20 VITALS — BP 90/61 | HR 84 | Temp 97.9°F | Resp 14 | Wt 186.0 lb

## 2023-09-20 DIAGNOSIS — Z1330 Encounter for screening examination for mental health and behavioral disorders, unspecified: Secondary | ICD-10-CM | POA: Diagnosis not present

## 2023-09-20 DIAGNOSIS — F411 Generalized anxiety disorder: Secondary | ICD-10-CM

## 2023-09-20 DIAGNOSIS — F3281 Premenstrual dysphoric disorder: Secondary | ICD-10-CM

## 2023-09-20 DIAGNOSIS — H109 Unspecified conjunctivitis: Secondary | ICD-10-CM | POA: Diagnosis not present

## 2023-09-20 DIAGNOSIS — F909 Attention-deficit hyperactivity disorder, unspecified type: Secondary | ICD-10-CM | POA: Diagnosis not present

## 2023-09-20 DIAGNOSIS — Z23 Encounter for immunization: Secondary | ICD-10-CM

## 2023-09-20 DIAGNOSIS — F331 Major depressive disorder, recurrent, moderate: Secondary | ICD-10-CM | POA: Diagnosis not present

## 2023-09-20 MED ORDER — ESCITALOPRAM OXALATE 20 MG PO TABS: 20.0000 mg | ORAL_TABLET | Freq: Every day | ORAL | 2 refills | Status: DC

## 2023-09-20 MED ORDER — POLYMYXIN B-TRIMETHOPRIM 10000-0.1 UNIT/ML-% OP SOLN: 1.0000 [drp] | OPHTHALMIC | 0 refills | Status: DC

## 2023-09-20 MED ORDER — AMPHETAMINE-DEXTROAMPHET ER 10 MG PO CP24: 10.0000 mg | ORAL_CAPSULE | Freq: Every day | ORAL | 0 refills | Status: DC

## 2023-09-20 MED ORDER — TRAZODONE HCL 50 MG PO TABS: 50.0000 mg | ORAL_TABLET | Freq: Every evening | ORAL | 3 refills | Status: DC | PRN

## 2023-09-20 NOTE — Patient Instructions (Addendum)
 Increase lexapro to 20mg  daily, ok to do 15mg  for 1 week then go up to 20mg   Continue adderall and trazadone as you are doing right now  Schedule therapy evaluation!

## 2023-09-20 NOTE — Progress Notes (Signed)
 Office Visit  BP 90/61 (BP Location: Left Arm, Patient Position: Sitting, Cuff Size: Normal)   Pulse 84   Temp 97.9 F (36.6 C) (Oral)   Resp 14   Wt 186 lb (84.4 kg)   LMP 08/25/2023 (Exact Date)   SpO2 98%   BMI 30.02 kg/m    Subjective:    Patient ID: Jean Gay, female    DOB: [birth year], 33 y.o.   MRN: [id number]  HPI: Jean Gay is a 33 y.o. female  Chief Complaint  Patient presents with   ADHD    Has been sick so unsure of how things are going. Does not feel great about how things go during the day since switching to pm dosing of Lexapro .     Discussed the use of AI scribe software for clinical note transcription with the patient, who gave verbal consent to proceed.  History of Present Illness   The patient, with a history of sinus infections and conjunctivitis, presents with a prolonged illness that began with a viral infection contracted from her son around Christmas. She initially did not seek medical attention, hoping the illness would resolve on its own. However, the viral infection progressed into a sinus infection and then descended into her chest. Subsequently, the patient developed conjunctivitis, initially in the left eye, which spread to the right eye by the following day. She began using prescribed eye drops on Monday, but the medication was misplaced by her son, and she requires a new prescription.  The patient also reports struggling with her mental health. She has been taking Lexapro , initially in the morning, which she found improved her mood and lengthened her fuse. However, after switching to taking the medication at night, she noticed a return of negative feelings and irritability in the afternoons. She expresses a desire to return to taking the medication in the morning if it will help manage these symptoms.  In addition to Lexapro , the patient has been prescribed Adderall. She reports that it helps her focus and reduces her distractibility, but  she still struggles with task-switching and completing sentences. She has not noticed any side effects from the medication, such as dry mouth, insomnia, or headaches. She has been unable to fully assess the medication's effects due to her prolonged illness and reduced work hours.  The patient also mentions taking Trazodone , which she finds helps her sleep better. She has been taking a whole pill, as she found half a pill was not effective. She has stopped taking melatonin since starting Trazodone  and Lexapro .      Follow up ADHD: patient is currently treated with Adderall XR 10 mg. She believes that the medication is somewhat effective. Limited by recent illness. Reports the following side effects: none.  Relevant past medical, surgical, family and social history reviewed and updated as indicated. Interim medical history since our last visit reviewed. Allergies and medications reviewed and updated.  ROS per HPI unless specifically indicated above     Objective:    BP 90/61 (BP Location: Left Arm, Patient Position: Sitting, Cuff Size: Normal)   Pulse 84   Temp 97.9 F (36.6 C) (Oral)   Resp 14   Wt 186 lb (84.4 kg)   LMP 08/25/2023 (Exact Date)   SpO2 98%   BMI 30.02 kg/m   Wt Readings from Last 3 Encounters:  09/20/23 186 lb (84.4 kg)  09/05/23 186 lb 12.8 oz (84.7 kg)  08/30/23 191 lb 6.4 oz (86.8 kg)     Physical Exam  Constitutional:      Appearance: Normal appearance.  Pulmonary:     Effort: Pulmonary effort is normal.  Musculoskeletal:        General: Normal range of motion.  Skin:    Comments: Normal skin color  Neurological:     General: No focal deficit present.     Mental Status: She is alert. Mental status is at baseline.  Psychiatric:        Mood and Affect: Mood normal.        Behavior: Behavior normal.        Thought Content: Thought content normal.         09/20/2023    3:27 PM 09/05/2023    1:29 PM 08/30/2023   11:03 AM 10/30/2022    1:48 PM 07/19/2022     3:33 PM  Depression screen PHQ 2/9  Decreased Interest 1 3 3  0 0  Down, Depressed, Hopeless 2 1 0 0 0  PHQ - 2 Score 3 4 3  0 0  Altered sleeping 1 3 3 2 3   Tired, decreased energy 2 3 3 2  0  Change in appetite 1 1 2  0 1  Feeling bad or failure about yourself  1 0 0 0 0  Trouble concentrating 2 3 3  0 0  Moving slowly or fidgety/restless 3 1 3  0 0  Suicidal thoughts 0 0 0 0 0  PHQ-9 Score 13 15 17 4 4   Difficult doing work/chores Somewhat difficult Extremely dIfficult Extremely dIfficult Not difficult at all Not difficult at all       09/20/2023    3:28 PM 09/05/2023    1:29 PM 08/30/2023   11:03 AM 10/30/2022    1:49 PM  GAD 7 : Generalized Anxiety Score  Nervous, Anxious, on Edge 1 3 3 1   Control/stop worrying 1 3 3 1   Worry too much - different things 1 3 3  0  Trouble relaxing 3 3 3 2   Restless 2 3 3  0  Easily annoyed or irritable 3 3 3  0  Afraid - awful might happen 1 3 2  0  Total GAD 7 Score 12 21 20 4   Anxiety Difficulty Somewhat difficult Extremely difficult Extremely difficult Somewhat difficult       Assessment & Plan:  Assessment & Plan    Moderate episode of recurrent major depressive disorder (HCC) GAD (generalized anxiety disorder) PMDD (premenstrual dysphoric disorder) Assessment & Plan: Comorbid GAD, PMDD (suspected) and MDD. On Lexapro , reports improvement in mood and patience when taken in the morning but worsening mood in the afternoon when taken at night. No significant side effects. -Increase Lexapro  to 20mg  daily, titrating with 1.5 pills for one week before moving to full dose. Can be taken in the morning or night as preferred.  Orders: -     Escitalopram  Oxalate; Take 1 tablet (20 mg total) by mouth daily.  Dispense: 30 tablet; Refill: 2 -     traZODone  HCl; Take 1 tablet (50 mg total) by mouth at bedtime as needed for sleep.  Dispense: 90 tablet; Refill: 3 -Continue Trazodone  at current dose.  Adult ADHD Assessment & Plan: On Adderall,  reports some improvement in focus but still struggling with task completion and distractions. No reported side effects. Continue current dose. -Continue Adderall and monitor effects, particularly in relation to afternoon mood changes.  Orders: -     Amphetamine -Dextroamphet ER; Take 1 capsule (10 mg total) by mouth daily.  Dispense: 30 capsule; Refill: 0  Conjunctivitis of both  eyes, unspecified conjunctivitis type Recently diagnosed. Lost the bottle requesting refills. -     Polymyxin B -Trimethoprim ; Place 1 drop into the left eye every 3 (three) hours.  Dispense: 10 mL; Refill: 0  Encounter for behavioral health screening As part of their intake evaluation, the patient was screened for depression, anxiety.  PHQ9 SCORE 13, GAD7 SCORE 12. Screening results positive for tested conditions. Improved from prior. See plan under problem/diagnosis above.  Follow up plan: Return in about 4 weeks (around 10/18/2023) for Mood.  Jurline Folger SHAUNNA NETT, MD

## 2023-09-20 NOTE — Assessment & Plan Note (Addendum)
 Comorbid GAD, PMDD (suspected) and MDD. On Lexapro , reports improvement in mood and patience when taken in the morning but worsening mood in the afternoon when taken at night. No significant side effects. -Increase Lexapro  to 20mg  daily, titrating with 1.5 pills for one week before moving to full dose. Can be taken in the morning or night as preferred.

## 2023-09-20 NOTE — Assessment & Plan Note (Signed)
 On Adderall, reports some improvement in focus but still struggling with task completion and distractions. No reported side effects. Continue current dose. -Continue Adderall and monitor effects, particularly in relation to afternoon mood changes.

## 2023-10-03 ENCOUNTER — Other Ambulatory Visit: Payer: Self-pay | Admitting: Pediatrics

## 2023-10-03 DIAGNOSIS — F331 Major depressive disorder, recurrent, moderate: Secondary | ICD-10-CM

## 2023-10-03 DIAGNOSIS — F411 Generalized anxiety disorder: Secondary | ICD-10-CM

## 2023-10-03 MED ORDER — ESCITALOPRAM OXALATE 20 MG PO TABS: 20.0000 mg | ORAL_TABLET | Freq: Every day | ORAL | 2 refills | Status: DC

## 2023-10-25 ENCOUNTER — Encounter: Payer: Self-pay | Admitting: Pediatrics

## 2023-10-25 ENCOUNTER — Ambulatory Visit: Payer: 59 | Admitting: Pediatrics

## 2023-10-25 VITALS — BP 107/68 | HR 77 | Temp 99.0°F | Resp 16 | Wt 186.6 lb

## 2023-10-25 DIAGNOSIS — F909 Attention-deficit hyperactivity disorder, unspecified type: Secondary | ICD-10-CM

## 2023-10-25 DIAGNOSIS — Z1330 Encounter for screening examination for mental health and behavioral disorders, unspecified: Secondary | ICD-10-CM

## 2023-10-25 DIAGNOSIS — F411 Generalized anxiety disorder: Secondary | ICD-10-CM | POA: Diagnosis not present

## 2023-10-25 DIAGNOSIS — F331 Major depressive disorder, recurrent, moderate: Secondary | ICD-10-CM

## 2023-10-25 MED ORDER — ESCITALOPRAM OXALATE 20 MG PO TABS: 20.0000 mg | ORAL_TABLET | Freq: Every day | ORAL | 3 refills | Status: DC

## 2023-10-25 MED ORDER — AMPHETAMINE-DEXTROAMPHET ER 15 MG PO CP24: 15.0000 mg | ORAL_CAPSULE | ORAL | 0 refills | Status: DC

## 2023-10-25 NOTE — Assessment & Plan Note (Addendum)
 Improved with increased Lexapro  dose to 20mg  daily. Recent panic attack possibly related to home stressors. Trazodone  available for acute anxiety episodes. -Continue Lexapro  20mg  daily. -Use Trazodone  50mg  (half tablet) as needed for acute anxiety or panic attacks. -Continue Trazodone  100mg  at bedtime. -Continue to seek therapy, considering insurance coverage and cost.

## 2023-10-25 NOTE — Patient Instructions (Signed)
 Trazadone 25mg  - as needed for anxiety/panic attack  Increase adderall to 15mg  Lexapro  20mg 

## 2023-10-25 NOTE — Progress Notes (Signed)
 Office Visit  BP 107/68 (BP Location: Left Arm, Patient Position: Sitting, Cuff Size: Large)   Pulse 77   Temp 99 F (37.2 C) (Oral)   Resp 16   Wt 186 lb 9.6 oz (84.6 kg)   SpO2 99%   BMI 30.12 kg/m    Subjective:    Patient ID: Jean Gay, female    DOB: December 30, 1990, 33 y.o.   MRN: [id number]  HPI: Jean Gay is a 33 y.o. female  Chief Complaint  Patient presents with   ADHD    Sleeping is much better. Mood is improved and much better through the day versus before the Lexapro  dose change. Did have bad anxiety attack yesterday.     Discussed the use of AI scribe software for clinical note transcription with the patient, who gave verbal consent to proceed.  History of Present Illness   Jean Gay is a 33 year old female with anxiety and ADHD who presents with a recent panic attack and medication management.  She experienced a panic attack yesterday, which was difficult to manage. The onset occurred in the late morning, progressing from feeling happy to quiet, and then experiencing the panic attack while driving home from work around 3 PM. She has had panic attacks in the past, particularly during high school, but they had decreased in frequency until recently. No specific trigger for the attack was identified.  She is currently taking Lexapro  20 mg daily, which has improved her overall mood since the dose was increased. She also takes Adderall, which helps with external distractions but does not fully address her difficulty in completing tasks and sentences. She takes Adderall typically between 8 and 9 AM, four days a week, and takes breaks on the other days. Despite the medication, she still experiences challenges with focus and task completion. Trazodone  is used for sleep, which is effective when combined with Lexapro . She takes the full pill for sleep and has been advised to use half a pill for panic attacks if needed.  She is in the process of finding a therapist but  has encountered challenges with availability and cost. She has a high deductible insurance plan and is exploring options to use an HSA card to help cover therapy expenses.     Relevant past medical, surgical, family and social history reviewed and updated as indicated. Interim medical history since our last visit reviewed. Allergies and medications reviewed and updated.  ROS per HPI unless specifically indicated above     Objective:    BP 107/68 (BP Location: Left Arm, Patient Position: Sitting, Cuff Size: Large)   Pulse 77   Temp 99 F (37.2 C) (Oral)   Resp 16   Wt 186 lb 9.6 oz (84.6 kg)   SpO2 99%   BMI 30.12 kg/m   Wt Readings from Last 3 Encounters:  10/25/23 186 lb 9.6 oz (84.6 kg)  09/20/23 186 lb (84.4 kg)  09/05/23 186 lb 12.8 oz (84.7 kg)     Physical Exam Constitutional:      Appearance: Normal appearance.  Pulmonary:     Effort: Pulmonary effort is normal.  Musculoskeletal:        General: Normal range of motion.  Skin:    Comments: Normal skin color  Neurological:     General: No focal deficit present.     Mental Status: She is alert. Mental status is at baseline.  Psychiatric:        Mood and Affect: Mood normal.  Behavior: Behavior normal.        Thought Content: Thought content normal.         10/25/2023    3:50 PM 09/20/2023    3:27 PM 09/05/2023    1:29 PM 08/30/2023   11:03 AM 10/30/2022    1:48 PM  Depression screen PHQ 2/9  Decreased Interest 1 1 3 3  0  Down, Depressed, Hopeless 2 2 1  0 0  PHQ - 2 Score 3 3 4 3  0  Altered sleeping 1 1 3 3 2   Tired, decreased energy 0 2 3 3 2   Change in appetite 1 1 1 2  0  Feeling bad or failure about yourself  0 1 0 0 0  Trouble concentrating 2 2 3 3  0  Moving slowly or fidgety/restless 2 3 1 3  0  Suicidal thoughts 0 0 0 0 0  PHQ-9 Score 9 13 15 17 4   Difficult doing work/chores Somewhat difficult Somewhat difficult Extremely dIfficult Extremely dIfficult Not difficult at all       10/25/2023     3:51 PM 09/20/2023    3:28 PM 09/05/2023    1:29 PM 08/30/2023   11:03 AM  GAD 7 : Generalized Anxiety Score  Nervous, Anxious, on Edge 2 1 3 3   Control/stop worrying 2 1 3 3   Worry too much - different things 1 1 3 3   Trouble relaxing 3 3 3 3   Restless 3 2 3 3   Easily annoyed or irritable 0 3 3 3   Afraid - awful might happen 0 1 3 2   Total GAD 7 Score 11 12 21 20   Anxiety Difficulty Somewhat difficult Somewhat difficult Extremely difficult Extremely difficult       Assessment & Plan:  Assessment & Plan   Adult ADHD Assessment & Plan: Partial improvement with Adderall 10mg  daily. Still experiencing difficulty with task completion and maintaining focus. -Increase Adderall to 15mg  daily. -Consider switching to Concerta  if experiencing drop off effects with increased Adderall dose.  Orders: -     Amphetamine -Dextroamphet ER; Take 1 capsule by mouth every morning.  Dispense: 30 capsule; Refill: 0  Moderate episode of recurrent major depressive disorder (HCC) GAD (generalized anxiety disorder) Assessment & Plan: Improved with increased Lexapro  dose to 20mg  daily. Recent panic attack possibly related to home stressors. Trazodone  available for acute anxiety episodes. -Continue Lexapro  20mg  daily. -Use Trazodone  50mg  (half tablet) as needed for acute anxiety or panic attacks. -Continue Trazodone  100mg  at bedtime. -Continue to seek therapy, considering insurance coverage and cost.  Orders: -     Escitalopram  Oxalate; Take 1 tablet (20 mg total) by mouth daily.  Dispense: 90 tablet; Refill: 3  Encounter for behavioral health screening As part of their intake evaluation, the patient was screened for depression, anxiety.  PHQ9 SCORE 9, GAD7 SCORE 11. Screening results positive for tested conditions. See plan under problem/diagnosis above.  Follow up plan: Return in about 2 weeks (around 11/08/2023) for Mood.  Issam Carlyon SHAUNNA NETT, MD   Approximately 30 minutes spent on patient encounter  today including assessment, counseling, diagnosing, treatment plan development, and charting.

## 2023-10-25 NOTE — Assessment & Plan Note (Signed)
 Partial improvement with Adderall 10mg  daily. Still experiencing difficulty with task completion and maintaining focus. -Increase Adderall to 15mg  daily. -Consider switching to Concerta  if experiencing "drop off" effects with increased Adderall dose.

## 2023-11-05 ENCOUNTER — Ambulatory Visit: Payer: 59 | Admitting: Nurse Practitioner

## 2023-11-05 ENCOUNTER — Encounter: Payer: Self-pay | Admitting: Nurse Practitioner

## 2023-11-05 VITALS — BP 120/80 | HR 78 | Ht 66.0 in | Wt 191.0 lb

## 2023-11-05 DIAGNOSIS — F909 Attention-deficit hyperactivity disorder, unspecified type: Secondary | ICD-10-CM

## 2023-11-05 DIAGNOSIS — Z23 Encounter for immunization: Secondary | ICD-10-CM

## 2023-11-05 DIAGNOSIS — F411 Generalized anxiety disorder: Secondary | ICD-10-CM | POA: Diagnosis not present

## 2023-11-05 DIAGNOSIS — Z136 Encounter for screening for cardiovascular disorders: Secondary | ICD-10-CM | POA: Diagnosis not present

## 2023-11-05 DIAGNOSIS — Z0000 Encounter for general adult medical examination without abnormal findings: Secondary | ICD-10-CM

## 2023-11-05 LAB — URINALYSIS, ROUTINE W REFLEX MICROSCOPIC
Bilirubin, UA: NEGATIVE
Glucose, UA: NEGATIVE
Ketones, UA: NEGATIVE
Leukocytes,UA: NEGATIVE
Nitrite, UA: NEGATIVE
Protein,UA: NEGATIVE
RBC, UA: NEGATIVE
Specific Gravity, UA: 1.01 (ref 1.005–1.030)
Urobilinogen, Ur: 0.2 mg/dL (ref 0.2–1.0)
pH, UA: 6.5 (ref 5.0–7.5)

## 2023-11-05 MED ORDER — METHYLPHENIDATE HCL ER (OSM) 18 MG PO TBCR: 18.0000 mg | EXTENDED_RELEASE_TABLET | Freq: Every day | ORAL | 0 refills | Status: DC

## 2023-11-05 NOTE — Assessment & Plan Note (Signed)
 Chronic.  Ongoing concern.  Having trouble completing tasks even with increased Adderall dose to 15mg .  Will change to Concerta 18mg  daily.  Side effects and benefits of medication discussed during visit today.  Follow up in 2 weeks.  Call sooner if concerns arise.

## 2023-11-05 NOTE — Progress Notes (Signed)
 BP 120/80 (BP Location: Left Arm, Patient Position: Sitting)   Pulse 78   Ht 5\' 6"  (1.676 m)   Wt 191 lb (86.6 kg)   LMP 10/24/2023   SpO2 99%   BMI 30.83 kg/m    Subjective:    Patient ID: Jean Gay, female    DOB: [birth year], 33 y.o.   MRN: [id number]  HPI: Jean Gay is a 33 y.o. female presenting on 11/05/2023 for comprehensive medical examination. Current medical complaints include: ADHD  She currently lives with: Menopausal Symptoms: no  ADHD Patient states she is still having trouble completing tasks and sentences.  She feels like she can't focus.  When she goes multiple days without taking the medication she has feelings of being on edge and over stimulated.   She feels more leveled out with lexapro.  Doing well with the current dose.  Trazodone is working well for her.   Depression Screen done today and results listed below:     11/05/2023    2:11 PM 10/25/2023    3:50 PM 09/20/2023    3:27 PM 09/05/2023    1:29 PM 08/30/2023   11:03 AM  Depression screen PHQ 2/9  Decreased Interest 1 1 1 3 3   Down, Depressed, Hopeless 1 2 2 1  0  PHQ - 2 Score 2 3 3 4 3   Altered sleeping 3 1 1 3 3   Tired, decreased energy 3 0 2 3 3   Change in appetite 2 1 1 1 2   Feeling bad or failure about yourself  0 0 1 0 0  Trouble concentrating 3 2 2 3 3   Moving slowly or fidgety/restless 2 2 3 1 3   Suicidal thoughts 0 0 0 0 0  PHQ-9 Score 15 9 13 15 17   Difficult doing work/chores Somewhat difficult Somewhat difficult Somewhat difficult Extremely dIfficult Extremely dIfficult    The patient does not have a history of falls. I did complete a risk assessment for falls. A plan of care for falls was documented.   Past Medical History:  Past Medical History:  Diagnosis Date   PMDD (premenstrual dysphoric disorder)    Raynaud disease    Venomous snake bite 03/11/2017    Surgical History:  Past Surgical History:  Procedure Laterality Date   WISDOM TOOTH EXTRACTION       Medications:  Current Outpatient Medications on File Prior to Visit  Medication Sig   escitalopram (LEXAPRO) 20 MG tablet Take 1 tablet (20 mg total) by mouth daily.   MELATONIN GUMMIES PO Take 10 mg by mouth as needed. Does take 2 most times.   traZODone (DESYREL) 50 MG tablet Take 1 tablet (50 mg total) by mouth at bedtime as needed for sleep.   No current facility-administered medications on file prior to visit.    Allergies:  Allergies  Allergen Reactions   Oseltamivir     Dizzy Dizzy     Social History:  Social History   Socioeconomic History   Marital status: Significant Other    Spouse name: Salome Arnt   Number of children: Not on file   Years of education: Not on file   Highest education level: Some college, no degree  Occupational History   Not on file  Tobacco Use   Smoking status: Never    Passive exposure: Never   Smokeless tobacco: Never  Vaping Use   Vaping status: Never Used  Substance and Sexual Activity   Alcohol use: Yes    Comment: on weekends/occasions  Drug use: Never   Sexual activity: Yes    Birth control/protection: Surgical  Other Topics Concern   Not on file  Social History Narrative   Not on file   Social Drivers of Health   Financial Resource Strain: Low Risk  (09/20/2023)   Overall Financial Resource Strain (CARDIA)    Difficulty of Paying Living Expenses: Not very hard  Food Insecurity: No Food Insecurity (09/20/2023)   Hunger Vital Sign    Worried About Running Out of Food in the Last Year: Never true    Ran Out of Food in the Last Year: Never true  Transportation Needs: No Transportation Needs (09/20/2023)   PRAPARE - Administrator, Civil Service (Medical): No    Lack of Transportation (Non-Medical): No  Physical Activity: Inactive (09/20/2023)   Exercise Vital Sign    Days of Exercise per Week: 0 days    Minutes of Exercise per Session: 0 min  Stress: Stress Concern Present (09/20/2023)   [employer] of  Occupational Health - Occupational Stress Questionnaire    Feeling of Stress : Very much  Social Connections: Moderately Integrated (09/20/2023)   Social Connection and Isolation Panel [NHANES]    Frequency of Communication with Friends and Family: More than three times a week    Frequency of Social Gatherings with Friends and Family: Once a week    Attends Religious Services: 1 to 4 times per year    Active Member of [employer] or Organizations: No    Attends Banker Meetings: Never    Marital Status: Living with partner  Recent Concern: Social Connections - Moderately Isolated (08/30/2023)   Social Connection and Isolation Panel [NHANES]    Frequency of Communication with Friends and Family: Once a week    Frequency of Social Gatherings with Friends and Family: More than three times a week    Attends Religious Services: Never    Database administrator or Organizations: No    Attends Banker Meetings: Never    Marital Status: Married  Catering manager Violence: Not At Risk (08/30/2023)   Humiliation, Afraid, Rape, and Kick questionnaire    Fear of Current or Ex-Partner: No    Emotionally Abused: No    Physically Abused: No    Sexually Abused: No   Social History   Tobacco Use  Smoking Status Never   Passive exposure: Never  Smokeless Tobacco Never   Social History   Substance and Sexual Activity  Alcohol Use Yes   Comment: on weekends/occasions    Family History:  Family History  Problem Relation Age of Onset   Hypertension Father    Rashes / Skin problems Sister    Healthy Brother    Diabetes Maternal Grandfather    Hypertension Maternal Grandfather    Heart disease Maternal Grandfather    Healthy Son     Past medical history, surgical history, medications, allergies, family history and social history reviewed with patient today and changes made to appropriate areas of the chart.   Review of Systems  Psychiatric/Behavioral:  The patient is  nervous/anxious.        Difficulty concentrating   All other ROS negative except what is listed above and in the HPI.      Objective:    BP 120/80 (BP Location: Left Arm, Patient Position: Sitting)   Pulse 78   Ht 5\' 6"  (1.676 m)   Wt 191 lb (86.6 kg)   LMP 10/24/2023   SpO2 99%  BMI 30.83 kg/m   Wt Readings from Last 3 Encounters:  11/05/23 191 lb (86.6 kg)  10/25/23 186 lb 9.6 oz (84.6 kg)  09/20/23 186 lb (84.4 kg)    Physical Exam Vitals and nursing note reviewed.  Constitutional:      General: She is awake. She is not in acute distress.    Appearance: Normal appearance. She is well-developed and normal weight. She is not ill-appearing, toxic-appearing or diaphoretic.  HENT:     Head: Normocephalic and atraumatic.     Right Ear: Hearing, tympanic membrane, ear canal and external ear normal. No drainage.     Left Ear: Hearing, tympanic membrane, ear canal and external ear normal. No drainage.     Nose: Nose normal.     Right Sinus: No maxillary sinus tenderness or frontal sinus tenderness.     Left Sinus: No maxillary sinus tenderness or frontal sinus tenderness.     Mouth/Throat:     Mouth: Mucous membranes are moist.     Pharynx: Oropharynx is clear. Uvula midline. No pharyngeal swelling, oropharyngeal exudate or posterior oropharyngeal erythema.  Eyes:     General: Lids are normal.        Right eye: No discharge.        Left eye: No discharge.     Extraocular Movements: Extraocular movements intact.     Conjunctiva/sclera: Conjunctivae normal.     Pupils: Pupils are equal, round, and reactive to light.     Visual Fields: Right eye visual fields normal and left eye visual fields normal.  Neck:     Thyroid: No thyromegaly.     Vascular: No carotid bruit.     Trachea: Trachea normal.  Cardiovascular:     Rate and Rhythm: Normal rate and regular rhythm.     Heart sounds: Normal heart sounds. No murmur heard.    No gallop.  Pulmonary:     Effort: Pulmonary  effort is normal. No accessory muscle usage or respiratory distress.     Breath sounds: Normal breath sounds. No wheezing or rales.  Chest:  Breasts:    Right: Normal.     Left: Normal.  Abdominal:     General: Bowel sounds are normal.     Palpations: Abdomen is soft. There is no hepatomegaly or splenomegaly.     Tenderness: There is no abdominal tenderness.  Musculoskeletal:        General: Normal range of motion.     Cervical back: Normal range of motion and neck supple.     Right lower leg: No edema.     Left lower leg: No edema.  Lymphadenopathy:     Head:     Right side of head: No submental, submandibular, tonsillar, preauricular or posterior auricular adenopathy.     Left side of head: No submental, submandibular, tonsillar, preauricular or posterior auricular adenopathy.     Cervical: No cervical adenopathy.     Upper Body:     Right upper body: No supraclavicular, axillary or pectoral adenopathy.     Left upper body: No supraclavicular, axillary or pectoral adenopathy.  Skin:    General: Skin is warm and dry.     Capillary Refill: Capillary refill takes less than 2 seconds.     Findings: No rash.  Neurological:     General: No focal deficit present.     Mental Status: She is alert and oriented to person, place, and time. Mental status is at baseline.     Gait: Gait is  intact.  Psychiatric:        Attention and Perception: Attention normal.        Mood and Affect: Mood normal.        Speech: Speech normal.        Behavior: Behavior normal. Behavior is cooperative.        Thought Content: Thought content normal.        Judgment: Judgment normal.     Results for orders placed or performed in visit on 10/30/22  CBC with Differential/Platelet   Collection Time: 10/30/22  1:56 PM  Result Value Ref Range   WBC 6.0 3.4 - 10.8 x10E3/uL   RBC 4.51 3.77 - 5.28 x10E6/uL   Hemoglobin 14.6 11.1 - 15.9 g/dL   Hematocrit 60.4 54.0 - 46.6 %   MCV 90 79 - 97 fL   MCH 32.4  26.6 - 33.0 pg   MCHC 36.0 (H) 31.5 - 35.7 g/dL   RDW 98.1 19.1 - 47.8 %   Platelets 319 150 - 450 x10E3/uL   Neutrophils 51 Not Estab. %   Lymphs 41 Not Estab. %   Monocytes 7 Not Estab. %   Eos 1 Not Estab. %   Basos 0 Not Estab. %   Neutrophils Absolute 3.1 1.4 - 7.0 x10E3/uL   Lymphocytes Absolute 2.4 0.7 - 3.1 x10E3/uL   Monocytes Absolute 0.4 0.1 - 0.9 x10E3/uL   EOS (ABSOLUTE) 0.0 0.0 - 0.4 x10E3/uL   Basophils Absolute 0.0 0.0 - 0.2 x10E3/uL   Immature Granulocytes 0 Not Estab. %   Immature Grans (Abs) 0.0 0.0 - 0.1 x10E3/uL  Comprehensive metabolic panel   Collection Time: 10/30/22  1:56 PM  Result Value Ref Range   Glucose 90 70 - 99 mg/dL   BUN 9 6 - 20 mg/dL   Creatinine, Ser 2.95 (H) 0.57 - 1.00 mg/dL   eGFR 72 >62 ZH/YQM/5.78   BUN/Creatinine Ratio 8 (L) 9 - 23   Sodium 138 134 - 144 mmol/L   Potassium 4.1 3.5 - 5.2 mmol/L   Chloride 99 96 - 106 mmol/L   CO2 23 20 - 29 mmol/L   Calcium 10.2 8.7 - 10.2 mg/dL   Total Protein 8.2 6.0 - 8.5 g/dL   Albumin 5.4 (H) 4.0 - 5.0 g/dL   Globulin, Total 2.8 1.5 - 4.5 g/dL   Albumin/Globulin Ratio 1.9 1.2 - 2.2   Bilirubin Total 0.5 0.0 - 1.2 mg/dL   Alkaline Phosphatase 90 44 - 121 IU/L   AST 18 0 - 40 IU/L   ALT 20 0 - 32 IU/L  Lipid panel   Collection Time: 10/30/22  1:56 PM  Result Value Ref Range   Cholesterol, Total 193 100 - 199 mg/dL   Triglycerides 83 0 - 149 mg/dL   HDL 73 >46 mg/dL   VLDL Cholesterol Cal 15 5 - 40 mg/dL   LDL Chol Calc (NIH) 962 (H) 0 - 99 mg/dL   Chol/HDL Ratio 2.6 0.0 - 4.4 ratio  TSH   Collection Time: 10/30/22  1:56 PM  Result Value Ref Range   TSH 2.150 0.450 - 4.500 uIU/mL  Urinalysis, Routine w reflex microscopic   Collection Time: 10/30/22  1:56 PM  Result Value Ref Range   Specific Gravity, UA 1.006 1.005 - 1.030   pH, UA 6.0 5.0 - 7.5   Color, UA Yellow Yellow   Appearance Ur Clear Clear   Leukocytes,UA Negative Negative   Protein,UA Negative Negative/Trace   Glucose,  UA Negative Negative  Ketones, UA Negative Negative   RBC, UA Negative Negative   Bilirubin, UA Negative Negative   Urobilinogen, Ur 0.2 0.2 - 1.0 mg/dL   Nitrite, UA Negative Negative   Microscopic Examination Comment   Hepatitis C Antibody   Collection Time: 10/30/22  1:56 PM  Result Value Ref Range   Hep C Virus Ab Non Reactive Non Reactive      Assessment & Plan:   Problem List Items Addressed This Visit       Other   GAD (generalized anxiety disorder)   Chronic.  Controlled.  Continue with current medication regimen of Lexapro 20mg  daily.  Labs ordered today.  Return to clinic in 6 months for reevaluation.  Call sooner if concerns arise.        Adult ADHD   Chronic.  Ongoing concern.  Having trouble completing tasks even with increased Adderall dose to 15mg .  Will change to Concerta 18mg  daily.  Side effects and benefits of medication discussed during visit today.  Follow up in 2 weeks.  Call sooner if concerns arise.       Other Visit Diagnoses       Annual physical exam    -  Primary   Health maintenance reviewed during visit today.  Labs ordered.  PAP up to date.  Vaccines reviewed.   Relevant Orders   CBC with Differential/Platelet   Comprehensive metabolic panel   Lipid panel   TSH   Urinalysis, Routine w reflex microscopic     Screening for ischemic heart disease       Relevant Orders   Lipid panel     Encounter for immunization       Relevant Orders   Flu vaccine trivalent PF, 6mos and older(Flulaval,Afluria,Fluarix,Fluzone) (Completed)        Follow up plan: Return in about 2 weeks (around 11/19/2023) for Medication Management.   LABORATORY TESTING:  - Pap smear: up to date  IMMUNIZATIONS:   - Tdap: Tetanus vaccination status reviewed: last tetanus booster within 10 years. - Influenza: Up to date - Pneumovax: Not applicable - Prevnar: Not applicable - COVID: Up to date - HPV: Not applicable - Shingrix vaccine: Not  applicable  SCREENING: -Mammogram: Not applicable  - Colonoscopy: Not applicable  - Bone Density: Not applicable  -Hearing Test: Not applicable  -Spirometry: Not applicable   PATIENT COUNSELING:   Advised to take 1 mg of folate supplement per day if capable of pregnancy.   Sexuality: Discussed sexually transmitted diseases, partner selection, use of condoms, avoidance of unintended pregnancy  and contraceptive alternatives.   Advised to avoid cigarette smoking.  I discussed with the patient that most people either abstain from alcohol or drink within safe limits (<=14/week and <=4 drinks/occasion for males, <=7/weeks and <= 3 drinks/occasion for females) and that the risk for alcohol disorders and other health effects rises proportionally with the number of drinks per week and how often a drinker exceeds daily limits.  Discussed cessation/primary prevention of drug use and availability of treatment for abuse.   Diet: Encouraged to adjust caloric intake to maintain  or achieve ideal body weight, to reduce intake of dietary saturated fat and total fat, to limit sodium intake by avoiding high sodium foods and not adding table salt, and to maintain adequate dietary potassium and calcium preferably from fresh fruits, vegetables, and low-fat dairy products.    stressed the importance of regular exercise  Injury prevention: Discussed safety belts, safety helmets, smoke detector, smoking near bedding or  upholstery.   Dental health: Discussed importance of regular tooth brushing, flossing, and dental visits.    NEXT PREVENTATIVE PHYSICAL DUE IN 1 YEAR. Return in about 2 weeks (around 11/19/2023) for Medication Management.

## 2023-11-05 NOTE — Addendum Note (Signed)
 Addended by: Larae Grooms on: 11/05/2023 03:19 PM   Modules accepted: Level of Service

## 2023-11-05 NOTE — Assessment & Plan Note (Signed)
Chronic.  Controlled.  Continue with current medication regimen of Lexapro 20mg  daily.  Labs ordered today.  Return to clinic in 6 months for reevaluation.  Call sooner if concerns arise.

## 2023-11-06 ENCOUNTER — Encounter: Payer: Self-pay | Admitting: Nurse Practitioner

## 2023-11-06 LAB — COMPREHENSIVE METABOLIC PANEL
ALT: 27 [IU]/L (ref 0–32)
AST: 19 [IU]/L (ref 0–40)
Albumin: 5.2 g/dL — ABNORMAL HIGH (ref 3.9–4.9)
Alkaline Phosphatase: 93 [IU]/L (ref 44–121)
BUN/Creatinine Ratio: 12 (ref 9–23)
BUN: 12 mg/dL (ref 6–20)
Bilirubin Total: 0.5 mg/dL (ref 0.0–1.2)
CO2: 25 mmol/L (ref 20–29)
Calcium: 9.8 mg/dL (ref 8.7–10.2)
Chloride: 99 mmol/L (ref 96–106)
Creatinine, Ser: 1.04 mg/dL — ABNORMAL HIGH (ref 0.57–1.00)
Globulin, Total: 2.7 g/dL (ref 1.5–4.5)
Glucose: 78 mg/dL (ref 70–99)
Potassium: 3.5 mmol/L (ref 3.5–5.2)
Sodium: 139 mmol/L (ref 134–144)
Total Protein: 7.9 g/dL (ref 6.0–8.5)
eGFR: 74 mL/min/{1.73_m2} (ref >59)

## 2023-11-06 LAB — LIPID PANEL
Chol/HDL Ratio: 2.4 {ratio} (ref 0.0–4.4)
Cholesterol, Total: 202 mg/dL — ABNORMAL HIGH (ref 100–199)
HDL: 84 mg/dL (ref >39)
LDL Chol Calc (NIH): 106 mg/dL — ABNORMAL HIGH (ref 0–99)
Triglycerides: 68 mg/dL (ref 0–149)
VLDL Cholesterol Cal: 12 mg/dL (ref 5–40)

## 2023-11-06 LAB — CBC WITH DIFFERENTIAL/PLATELET
Basophils Absolute: 0 10*3/uL (ref 0.0–0.2)
Basos: 0 %
EOS (ABSOLUTE): 0 10*3/uL (ref 0.0–0.4)
Eos: 0 %
Hematocrit: 40.3 % (ref 34.0–46.6)
Hemoglobin: 13.8 g/dL (ref 11.1–15.9)
Immature Grans (Abs): 0 10*3/uL (ref 0.0–0.1)
Immature Granulocytes: 0 %
Lymphocytes Absolute: 2.7 10*3/uL (ref 0.7–3.1)
Lymphs: 38 %
MCH: 32.2 pg (ref 26.6–33.0)
MCHC: 34.2 g/dL (ref 31.5–35.7)
MCV: 94 fL (ref 79–97)
Monocytes Absolute: 0.4 10*3/uL (ref 0.1–0.9)
Monocytes: 6 %
Neutrophils Absolute: 3.9 10*3/uL (ref 1.4–7.0)
Neutrophils: 56 %
Platelets: 269 10*3/uL (ref 150–450)
RBC: 4.29 x10E6/uL (ref 3.77–5.28)
RDW: 11.9 % (ref 11.7–15.4)
WBC: 7.1 10*3/uL (ref 3.4–10.8)

## 2023-11-06 LAB — TSH: TSH: 1.94 u[IU]/mL (ref 0.450–4.500)

## 2023-11-15 ENCOUNTER — Ambulatory Visit: Payer: 59 | Admitting: Pediatrics

## 2023-11-28 ENCOUNTER — Encounter: Payer: Self-pay | Admitting: Nurse Practitioner

## 2023-11-28 ENCOUNTER — Ambulatory Visit: Payer: 59 | Admitting: Nurse Practitioner

## 2023-11-28 VITALS — BP 112/78 | HR 78 | Ht 66.0 in | Wt 189.4 lb

## 2023-11-28 DIAGNOSIS — F909 Attention-deficit hyperactivity disorder, unspecified type: Secondary | ICD-10-CM

## 2023-11-28 DIAGNOSIS — F411 Generalized anxiety disorder: Secondary | ICD-10-CM

## 2023-11-28 DIAGNOSIS — F331 Major depressive disorder, recurrent, moderate: Secondary | ICD-10-CM

## 2023-11-28 MED ORDER — CITALOPRAM HYDROBROMIDE 10 MG PO TABS: 10.0000 mg | ORAL_TABLET | Freq: Every day | ORAL | 0 refills | Status: DC

## 2023-11-28 MED ORDER — AMPHETAMINE-DEXTROAMPHET ER 20 MG PO CP24: 20.0000 mg | ORAL_CAPSULE | ORAL | 0 refills | Status: DC

## 2023-11-28 NOTE — Progress Notes (Unsigned)
 Ht 5\' 6"  (1.676 m)   Wt 189 lb 6.4 oz (85.9 kg)   LMP 10/24/2023   BMI 30.57 kg/m    Subjective:    Patient ID: Jean Gay, female    DOB: [birth year], 33 y.o.   MRN: [id number]  HPI: Jean Gay is a 33 y.o. female  Chief Complaint  Patient presents with   Medication Management    Did not like the switch from Adderall, would like another alternative    Follow up ADHD: patient is currently treated with Concerta 18 mg. She believes that the medication is ineffective. Reports the following side effects: she feels like after a week of taking the medication she was having trouble making any type of decision.  She feels like the Adderall makes her more anxious.  She just found a therapist and has her first appointment on 3/27.  Relevant past medical, surgical, family and social history reviewed and updated as indicated. Interim medical history since our last visit reviewed. Allergies and medications reviewed and updated.  Review of Systems  Psychiatric/Behavioral:  Positive for decreased concentration and dysphoric mood. Negative for suicidal ideas. The patient is nervous/anxious.     Per HPI unless specifically indicated above     Objective:    Ht 5\' 6"  (1.676 m)   Wt 189 lb 6.4 oz (85.9 kg)   LMP 10/24/2023   BMI 30.57 kg/m   Wt Readings from Last 3 Encounters:  11/28/23 189 lb 6.4 oz (85.9 kg)  11/05/23 191 lb (86.6 kg)  10/25/23 186 lb 9.6 oz (84.6 kg)    Physical Exam Vitals and nursing note reviewed.  Constitutional:      General: She is not in acute distress.    Appearance: Normal appearance. She is normal weight. She is not ill-appearing, toxic-appearing or diaphoretic.  HENT:     Head: Normocephalic.     Right Ear: External ear normal.     Left Ear: External ear normal.     Nose: Nose normal.     Mouth/Throat:     Mouth: Mucous membranes are moist.     Pharynx: Oropharynx is clear.  Eyes:     General:        Right eye: No discharge.        Left  eye: No discharge.     Extraocular Movements: Extraocular movements intact.     Conjunctiva/sclera: Conjunctivae normal.     Pupils: Pupils are equal, round, and reactive to light.  Cardiovascular:     Rate and Rhythm: Normal rate and regular rhythm.     Heart sounds: No murmur heard. Pulmonary:     Effort: Pulmonary effort is normal. No respiratory distress.     Breath sounds: Normal breath sounds. No wheezing or rales.  Musculoskeletal:     Cervical back: Normal range of motion and neck supple.  Skin:    General: Skin is warm and dry.     Capillary Refill: Capillary refill takes less than 2 seconds.  Neurological:     General: No focal deficit present.     Mental Status: She is alert and oriented to person, place, and time. Mental status is at baseline.  Psychiatric:        Mood and Affect: Mood normal.        Behavior: Behavior normal.        Thought Content: Thought content normal.        Judgment: Judgment normal.     Results for orders placed or  performed in visit on 11/05/23  Urinalysis, Routine w reflex microscopic   Collection Time: 11/05/23  2:44 PM  Result Value Ref Range   Specific Gravity, UA 1.010 1.005 - 1.030   pH, UA 6.5 5.0 - 7.5   Color, UA Yellow Yellow   Appearance Ur Clear Clear   Leukocytes,UA Negative Negative   Protein,UA Negative Negative/Trace   Glucose, UA Negative Negative   Ketones, UA Negative Negative   RBC, UA Negative Negative   Bilirubin, UA Negative Negative   Urobilinogen, Ur 0.2 0.2 - 1.0 mg/dL   Nitrite, UA Negative Negative   Microscopic Examination Comment   CBC with Differential/Platelet   Collection Time: 11/05/23  2:45 PM  Result Value Ref Range   WBC 7.1 3.4 - 10.8 x10E3/uL   RBC 4.29 3.77 - 5.28 x10E6/uL   Hemoglobin 13.8 11.1 - 15.9 g/dL   Hematocrit 45.4 09.8 - 46.6 %   MCV 94 79 - 97 fL   MCH 32.2 26.6 - 33.0 pg   MCHC 34.2 31.5 - 35.7 g/dL   RDW 11.9 14.7 - 82.9 %   Platelets 269 150 - 450 x10E3/uL   Neutrophils  56 Not Estab. %   Lymphs 38 Not Estab. %   Monocytes 6 Not Estab. %   Eos 0 Not Estab. %   Basos 0 Not Estab. %   Neutrophils Absolute 3.9 1.4 - 7.0 x10E3/uL   Lymphocytes Absolute 2.7 0.7 - 3.1 x10E3/uL   Monocytes Absolute 0.4 0.1 - 0.9 x10E3/uL   EOS (ABSOLUTE) 0.0 0.0 - 0.4 x10E3/uL   Basophils Absolute 0.0 0.0 - 0.2 x10E3/uL   Immature Granulocytes 0 Not Estab. %   Immature Grans (Abs) 0.0 0.0 - 0.1 x10E3/uL  Comprehensive metabolic panel   Collection Time: 11/05/23  2:45 PM  Result Value Ref Range   Glucose 78 70 - 99 mg/dL   BUN 12 6 - 20 mg/dL   Creatinine, Ser 5.62 (H) 0.57 - 1.00 mg/dL   eGFR 74 >13 YQ/MVH/8.46   BUN/Creatinine Ratio 12 9 - 23   Sodium 139 134 - 144 mmol/L   Potassium 3.5 3.5 - 5.2 mmol/L   Chloride 99 96 - 106 mmol/L   CO2 25 20 - 29 mmol/L   Calcium 9.8 8.7 - 10.2 mg/dL   Total Protein 7.9 6.0 - 8.5 g/dL   Albumin 5.2 (H) 3.9 - 4.9 g/dL   Globulin, Total 2.7 1.5 - 4.5 g/dL   Bilirubin Total 0.5 0.0 - 1.2 mg/dL   Alkaline Phosphatase 93 44 - 121 IU/L   AST 19 0 - 40 IU/L   ALT 27 0 - 32 IU/L  Lipid panel   Collection Time: 11/05/23  2:45 PM  Result Value Ref Range   Cholesterol, Total 202 (H) 100 - 199 mg/dL   Triglycerides 68 0 - 149 mg/dL   HDL 84 >96 mg/dL   VLDL Cholesterol Cal 12 5 - 40 mg/dL   LDL Chol Calc (NIH) 295 (H) 0 - 99 mg/dL   Chol/HDL Ratio 2.4 0.0 - 4.4 ratio  TSH   Collection Time: 11/05/23  2:45 PM  Result Value Ref Range   TSH 1.940 0.450 - 4.500 uIU/mL      Assessment & Plan:   Problem List Items Addressed This Visit   None    Follow up plan: No follow-ups on file.

## 2023-11-29 NOTE — Assessment & Plan Note (Signed)
 Chronic. Depression is well controlled on Lexapro.  However, anxiety is not.  Did not have a sex drive with Zoloft. Will change to Citalopram 10mg  daily.  Side effects and benefits of medication discussed during visit.  Follow up in 1 month.  Call sooner if concerns arise. She does have an appt with a therapist on March 27.

## 2023-11-29 NOTE — Assessment & Plan Note (Signed)
 Chronic.  Patient did not do well with Concerta.  Would like to switch back to Adderall which was helping her symptoms.  15mg  tab was very expensive.  Increased dose to 20mg  to see if cost is more effective.  If she would like to go back to 15mg  we will break it into a 10mg  and 5mg .  Follow up in 1 month.  Call sooner if concerns arise.

## 2023-11-29 NOTE — Assessment & Plan Note (Signed)
 Chronic. Depression is well controlled on Lexapro.  However, anxiety is not.  Did not have a sex drive with Zoloft. Will change to Citalopram 10mg  daily.  Side effects and benefits of medication discussed during visit.  Follow up in 1 month.  Call sooner if concerns arise.

## 2024-01-01 ENCOUNTER — Ambulatory Visit: Admitting: Nurse Practitioner

## 2024-01-01 ENCOUNTER — Telehealth: Payer: Self-pay

## 2024-01-01 NOTE — Telephone Encounter (Signed)
 Unable to reach patient to check in for her virtual appointment. If she returns call please schedule her a new appointment with PCP.

## 2024-01-01 NOTE — Progress Notes (Deleted)
 There were no vitals taken for this visit.   Subjective:    Patient ID: Jean Gay, female    DOB: November 18, 1990, 33 y.o.   MRN: [id number]  HPI: Jean Gay is a 33 y.o. female  No chief complaint on file.  Follow up ADHD: patient is currently treated with Concerta 18 mg. She believes that the medication is ineffective. Reports the following side effects: she feels like after a week of taking the medication she was having trouble making any type of decision.  She feels like the Adderall makes her more anxious.  She just found a therapist and has her first appointment on 3/27.  Relevant past medical, surgical, family and social history reviewed and updated as indicated. Interim medical history since our last visit reviewed. Allergies and medications reviewed and updated.  Review of Systems  Psychiatric/Behavioral:  Positive for decreased concentration and dysphoric mood. Negative for suicidal ideas. The patient is nervous/anxious.     Per HPI unless specifically indicated above     Objective:    There were no vitals taken for this visit.  Wt Readings from Last 3 Encounters:  11/28/23 189 lb 6.4 oz (85.9 kg)  11/05/23 191 lb (86.6 kg)  10/25/23 186 lb 9.6 oz (84.6 kg)    Physical Exam Vitals and nursing note reviewed.  Constitutional:      General: She is not in acute distress.    Appearance: Normal appearance. She is normal weight. She is not ill-appearing, toxic-appearing or diaphoretic.  HENT:     Head: Normocephalic.     Right Ear: External ear normal.     Left Ear: External ear normal.     Nose: Nose normal.     Mouth/Throat:     Mouth: Mucous membranes are moist.     Pharynx: Oropharynx is clear.  Eyes:     General:        Right eye: No discharge.        Left eye: No discharge.     Extraocular Movements: Extraocular movements intact.     Conjunctiva/sclera: Conjunctivae normal.     Pupils: Pupils are equal, round, and reactive to light.  Cardiovascular:      Rate and Rhythm: Normal rate and regular rhythm.     Heart sounds: No murmur heard. Pulmonary:     Effort: Pulmonary effort is normal. No respiratory distress.     Breath sounds: Normal breath sounds. No wheezing or rales.  Musculoskeletal:     Cervical back: Normal range of motion and neck supple.  Skin:    General: Skin is warm and dry.     Capillary Refill: Capillary refill takes less than 2 seconds.  Neurological:     General: No focal deficit present.     Mental Status: She is alert and oriented to person, place, and time. Mental status is at baseline.  Psychiatric:        Mood and Affect: Mood normal.        Behavior: Behavior normal.        Thought Content: Thought content normal.        Judgment: Judgment normal.    Results for orders placed or performed in visit on 11/05/23  Urinalysis, Routine w reflex microscopic   Collection Time: 11/05/23  2:44 PM  Result Value Ref Range   Specific Gravity, UA 1.010 1.005 - 1.030   pH, UA 6.5 5.0 - 7.5   Color, UA Yellow Yellow   Appearance Ur Clear Clear  Leukocytes,UA Negative Negative   Protein,UA Negative Negative/Trace   Glucose, UA Negative Negative   Ketones, UA Negative Negative   RBC, UA Negative Negative   Bilirubin, UA Negative Negative   Urobilinogen, Ur 0.2 0.2 - 1.0 mg/dL   Nitrite, UA Negative Negative   Microscopic Examination Comment   CBC with Differential/Platelet   Collection Time: 11/05/23  2:45 PM  Result Value Ref Range   WBC 7.1 3.4 - 10.8 x10E3/uL   RBC 4.29 3.77 - 5.28 x10E6/uL   Hemoglobin 13.8 11.1 - 15.9 g/dL   Hematocrit 45.4 09.8 - 46.6 %   MCV 94 79 - 97 fL   MCH 32.2 26.6 - 33.0 pg   MCHC 34.2 31.5 - 35.7 g/dL   RDW 11.9 14.7 - 82.9 %   Platelets 269 150 - 450 x10E3/uL   Neutrophils 56 Not Estab. %   Lymphs 38 Not Estab. %   Monocytes 6 Not Estab. %   Eos 0 Not Estab. %   Basos 0 Not Estab. %   Neutrophils Absolute 3.9 1.4 - 7.0 x10E3/uL   Lymphocytes Absolute 2.7 0.7 - 3.1  x10E3/uL   Monocytes Absolute 0.4 0.1 - 0.9 x10E3/uL   EOS (ABSOLUTE) 0.0 0.0 - 0.4 x10E3/uL   Basophils Absolute 0.0 0.0 - 0.2 x10E3/uL   Immature Granulocytes 0 Not Estab. %   Immature Grans (Abs) 0.0 0.0 - 0.1 x10E3/uL  Comprehensive metabolic panel   Collection Time: 11/05/23  2:45 PM  Result Value Ref Range   Glucose 78 70 - 99 mg/dL   BUN 12 6 - 20 mg/dL   Creatinine, Ser 5.62 (H) 0.57 - 1.00 mg/dL   eGFR 74 >13 YQ/MVH/8.46   BUN/Creatinine Ratio 12 9 - 23   Sodium 139 134 - 144 mmol/L   Potassium 3.5 3.5 - 5.2 mmol/L   Chloride 99 96 - 106 mmol/L   CO2 25 20 - 29 mmol/L   Calcium 9.8 8.7 - 10.2 mg/dL   Total Protein 7.9 6.0 - 8.5 g/dL   Albumin 5.2 (H) 3.9 - 4.9 g/dL   Globulin, Total 2.7 1.5 - 4.5 g/dL   Bilirubin Total 0.5 0.0 - 1.2 mg/dL   Alkaline Phosphatase 93 44 - 121 IU/L   AST 19 0 - 40 IU/L   ALT 27 0 - 32 IU/L  Lipid panel   Collection Time: 11/05/23  2:45 PM  Result Value Ref Range   Cholesterol, Total 202 (H) 100 - 199 mg/dL   Triglycerides 68 0 - 149 mg/dL   HDL 84 >96 mg/dL   VLDL Cholesterol Cal 12 5 - 40 mg/dL   LDL Chol Calc (NIH) 295 (H) 0 - 99 mg/dL   Chol/HDL Ratio 2.4 0.0 - 4.4 ratio  TSH   Collection Time: 11/05/23  2:45 PM  Result Value Ref Range   TSH 1.940 0.450 - 4.500 uIU/mL      Assessment & Plan:   Problem List Items Addressed This Visit       Other   Moderate episode of recurrent major depressive disorder (HCC)   GAD (generalized anxiety disorder) - Primary     Follow up plan: No follow-ups on file.

## 2024-01-03 ENCOUNTER — Ambulatory Visit: Admitting: Pediatrics

## 2024-01-04 ENCOUNTER — Other Ambulatory Visit: Payer: Self-pay | Admitting: Nurse Practitioner

## 2024-01-05 ENCOUNTER — Encounter: Payer: Self-pay | Admitting: Nurse Practitioner

## 2024-01-06 ENCOUNTER — Encounter: Payer: Self-pay | Admitting: Nurse Practitioner

## 2024-01-06 ENCOUNTER — Telehealth (INDEPENDENT_AMBULATORY_CARE_PROVIDER_SITE_OTHER): Admitting: Nurse Practitioner

## 2024-01-06 DIAGNOSIS — F909 Attention-deficit hyperactivity disorder, unspecified type: Secondary | ICD-10-CM

## 2024-01-06 DIAGNOSIS — F331 Major depressive disorder, recurrent, moderate: Secondary | ICD-10-CM

## 2024-01-06 MED ORDER — CITALOPRAM HYDROBROMIDE 40 MG PO TABS: 40.0000 mg | ORAL_TABLET | Freq: Every day | ORAL | 0 refills | Status: DC

## 2024-01-06 MED ORDER — CITALOPRAM HYDROBROMIDE 10 MG PO TABS: 10.0000 mg | ORAL_TABLET | Freq: Every day | ORAL | 0 refills | Status: DC

## 2024-01-06 MED ORDER — AMPHETAMINE-DEXTROAMPHET ER 20 MG PO CP24: 20.0000 mg | ORAL_CAPSULE | ORAL | 0 refills | Status: DC

## 2024-01-06 NOTE — Assessment & Plan Note (Signed)
 Chronic.  Improved.  Will increase dose to 40mg  daily.  Follow up in 1 month.  Call sooner if concerns arise.

## 2024-01-06 NOTE — Assessment & Plan Note (Signed)
 Chronic.  Doing well with Adderall 20mg  daily.  15mg  tab was very expensive.  Increased dose to 20mg  to see if cost is more effective.  Refills sent today.  PDMP checked.   Follow up in 1 month.  Call sooner if concerns arise.

## 2024-01-06 NOTE — Progress Notes (Signed)
 +  LMP 12/16/2023 (Exact Date)    Subjective:    Patient ID: Jean Gay, female    DOB: August 07, 1991, 33 y.o.   MRN: [id number]  HPI: Jean Gay is a 33 y.o. female  Chief Complaint  Patient presents with   Anxiety   Depression    Pt wants to possibly increase dosage    Follow up ADHD: patient is currently treated with Adderall 20mg . She believes that the medication is effective.  Reports the following side effects: she feels like after a week of taking the medication she was having trouble making any type of decision.  She feels like the Adderall makes her more anxious.  She just found a therapist and has her first appointment on 3/27.  She feels like the citalopram  was working for her.  But she does feel like she needs the dose increased.  She noticed a big difference when she came off the medication. She is still using the Trazodone  for sleep and feels like that works well for her.    Relevant past medical, surgical, family and social history reviewed and updated as indicated. Interim medical history since our last visit reviewed. Allergies and medications reviewed and updated.  Review of Systems  Psychiatric/Behavioral:  Positive for decreased concentration and dysphoric mood. Negative for suicidal ideas. The patient is nervous/anxious.     Per HPI unless specifically indicated above     Objective:    LMP 12/16/2023 (Exact Date)   Wt Readings from Last 3 Encounters:  11/28/23 189 lb 6.4 oz (85.9 kg)  11/05/23 191 lb (86.6 kg)  10/25/23 186 lb 9.6 oz (84.6 kg)    Physical Exam Vitals and nursing note reviewed.  HENT:     Head: Normocephalic.     Right Ear: Hearing normal.     Left Ear: Hearing normal.     Nose: Nose normal.  Eyes:     Pupils: Pupils are equal, round, and reactive to light.  Pulmonary:     Effort: Pulmonary effort is normal. No respiratory distress.  Neurological:     Mental Status: She is alert.  Psychiatric:        Mood and Affect: Mood  normal.        Behavior: Behavior normal.        Thought Content: Thought content normal.        Judgment: Judgment normal.     Results for orders placed or performed in visit on 11/05/23  Urinalysis, Routine w reflex microscopic   Collection Time: 11/05/23  2:44 PM  Result Value Ref Range   Specific Gravity, UA 1.010 1.005 - 1.030   pH, UA 6.5 5.0 - 7.5   Color, UA Yellow Yellow   Appearance Ur Clear Clear   Leukocytes,UA Negative Negative   Protein,UA Negative Negative/Trace   Glucose, UA Negative Negative   Ketones, UA Negative Negative   RBC, UA Negative Negative   Bilirubin, UA Negative Negative   Urobilinogen, Ur 0.2 0.2 - 1.0 mg/dL   Nitrite, UA Negative Negative   Microscopic Examination Comment   CBC with Differential/Platelet   Collection Time: 11/05/23  2:45 PM  Result Value Ref Range   WBC 7.1 3.4 - 10.8 x10E3/uL   RBC 4.29 3.77 - 5.28 x10E6/uL   Hemoglobin 13.8 11.1 - 15.9 g/dL   Hematocrit 40.9 81.1 - 46.6 %   MCV 94 79 - 97 fL   MCH 32.2 26.6 - 33.0 pg   MCHC 34.2 31.5 - 35.7 g/dL  RDW 11.9 11.7 - 15.4 %   Platelets 269 150 - 450 x10E3/uL   Neutrophils 56 Not Estab. %   Lymphs 38 Not Estab. %   Monocytes 6 Not Estab. %   Eos 0 Not Estab. %   Basos 0 Not Estab. %   Neutrophils Absolute 3.9 1.4 - 7.0 x10E3/uL   Lymphocytes Absolute 2.7 0.7 - 3.1 x10E3/uL   Monocytes Absolute 0.4 0.1 - 0.9 x10E3/uL   EOS (ABSOLUTE) 0.0 0.0 - 0.4 x10E3/uL   Basophils Absolute 0.0 0.0 - 0.2 x10E3/uL   Immature Granulocytes 0 Not Estab. %   Immature Grans (Abs) 0.0 0.0 - 0.1 x10E3/uL  Comprehensive metabolic panel   Collection Time: 11/05/23  2:45 PM  Result Value Ref Range   Glucose 78 70 - 99 mg/dL   BUN 12 6 - 20 mg/dL   Creatinine, Ser 7.82 (H) 0.57 - 1.00 mg/dL   eGFR 74 >95 AO/ZHY/8.65   BUN/Creatinine Ratio 12 9 - 23   Sodium 139 134 - 144 mmol/L   Potassium 3.5 3.5 - 5.2 mmol/L   Chloride 99 96 - 106 mmol/L   CO2 25 20 - 29 mmol/L   Calcium 9.8 8.7 - 10.2  mg/dL   Total Protein 7.9 6.0 - 8.5 g/dL   Albumin 5.2 (H) 3.9 - 4.9 g/dL   Globulin, Total 2.7 1.5 - 4.5 g/dL   Bilirubin Total 0.5 0.0 - 1.2 mg/dL   Alkaline Phosphatase 93 44 - 121 IU/L   AST 19 0 - 40 IU/L   ALT 27 0 - 32 IU/L  Lipid panel   Collection Time: 11/05/23  2:45 PM  Result Value Ref Range   Cholesterol, Total 202 (H) 100 - 199 mg/dL   Triglycerides 68 0 - 149 mg/dL   HDL 84 >78 mg/dL   VLDL Cholesterol Cal 12 5 - 40 mg/dL   LDL Chol Calc (NIH) 469 (H) 0 - 99 mg/dL   Chol/HDL Ratio 2.4 0.0 - 4.4 ratio  TSH   Collection Time: 11/05/23  2:45 PM  Result Value Ref Range   TSH 1.940 0.450 - 4.500 uIU/mL      Assessment & Plan:   Problem List Items Addressed This Visit       Other   Moderate episode of recurrent major depressive disorder (HCC) - Primary   Chronic.  Improved.  Will increase dose to 40mg  daily.  Follow up in 1 month.  Call sooner if concerns arise.       Relevant Medications   citalopram  (CELEXA ) 40 MG tablet   Adult ADHD   Chronic.  Doing well with Adderall 20mg  daily.  15mg  tab was very expensive.  Increased dose to 20mg  to see if cost is more effective.  Refills sent today.  PDMP checked.   Follow up in 1 month.  Call sooner if concerns arise.          Follow up plan: Return in about 1 month (around 02/05/2024) for Depression/Anxiety FU (virtual).   This visit was completed via MyChart due to the restrictions of the COVID-19 pandemic. All issues as above were discussed and addressed. Physical exam was done as above through visual confirmation on MyChart. If it was felt that the patient should be evaluated in the office, they were directed there. The patient verbally consented to this visit. Location of the patient: Home Location of the provider: Office Those involved with this call:  Provider: Aileen Alexanders, NP CMA: Mancil Seat, CMA Front Desk/Registration: Santana Cue  Hadassah Letters This encounter was conducted via video.  I spent 30 dedicated to  the care of this patient on the date of this encounter to include previsit review of symptoms, plan of are and follow up, face to face time with the patient, and post visit ordering of testing.

## 2024-01-06 NOTE — Progress Notes (Signed)
 Called patient to inform her to give the office a call and schedule an appointment for 1 month for a virtual appt [facility]

## 2024-01-28 ENCOUNTER — Ambulatory Visit: Admitting: Pediatrics

## 2024-02-12 ENCOUNTER — Ambulatory Visit: Admitting: Nurse Practitioner

## 2024-02-12 ENCOUNTER — Encounter: Payer: Self-pay | Admitting: Nurse Practitioner

## 2024-02-12 NOTE — Progress Notes (Deleted)
 +  There were no vitals taken for this visit.   Subjective:    Patient ID: Jean Gay, female    DOB: Aug 07, 1991, 33 y.o.   MRN: [id number]  HPI: Jean Gay is a 32 y.o. female  No chief complaint on file.  Follow up ADHD: patient is currently treated with Adderall 20mg . She believes that the medication is effective.  Reports the following side effects: she feels like after a week of taking the medication she was having trouble making any type of decision.  She feels like the Adderall makes her more anxious.  She just found a therapist and has her first appointment on 3/27.  She feels like the citalopram  was working for her.  But she does feel like she needs the dose increased.  She noticed a big difference when she came off the medication. She is still using the Trazodone  for sleep and feels like that works well for her.    Relevant past medical, surgical, family and social history reviewed and updated as indicated. Interim medical history since our last visit reviewed. Allergies and medications reviewed and updated.  Review of Systems  Psychiatric/Behavioral:  Positive for decreased concentration and dysphoric mood. Negative for suicidal ideas. The patient is nervous/anxious.     Per HPI unless specifically indicated above     Objective:    There were no vitals taken for this visit.  Wt Readings from Last 3 Encounters:  11/28/23 189 lb 6.4 oz (85.9 kg)  11/05/23 191 lb (86.6 kg)  10/25/23 186 lb 9.6 oz (84.6 kg)    Physical Exam Vitals and nursing note reviewed.  HENT:     Head: Normocephalic.     Right Ear: Hearing normal.     Left Ear: Hearing normal.     Nose: Nose normal.  Eyes:     Pupils: Pupils are equal, round, and reactive to light.  Pulmonary:     Effort: Pulmonary effort is normal. No respiratory distress.  Neurological:     Mental Status: She is alert.  Psychiatric:        Mood and Affect: Mood normal.        Behavior: Behavior normal.         Thought Content: Thought content normal.        Judgment: Judgment normal.    Results for orders placed or performed in visit on 11/05/23  Urinalysis, Routine w reflex microscopic   Collection Time: 11/05/23  2:44 PM  Result Value Ref Range   Specific Gravity, UA 1.010 1.005 - 1.030   pH, UA 6.5 5.0 - 7.5   Color, UA Yellow Yellow   Appearance Ur Clear Clear   Leukocytes,UA Negative Negative   Protein,UA Negative Negative/Trace   Glucose, UA Negative Negative   Ketones, UA Negative Negative   RBC, UA Negative Negative   Bilirubin, UA Negative Negative   Urobilinogen, Ur 0.2 0.2 - 1.0 mg/dL   Nitrite, UA Negative Negative   Microscopic Examination Comment   CBC with Differential/Platelet   Collection Time: 11/05/23  2:45 PM  Result Value Ref Range   WBC 7.1 3.4 - 10.8 x10E3/uL   RBC 4.29 3.77 - 5.28 x10E6/uL   Hemoglobin 13.8 11.1 - 15.9 g/dL   Hematocrit 13.0 86.5 - 46.6 %   MCV 94 79 - 97 fL   MCH 32.2 26.6 - 33.0 pg   MCHC 34.2 31.5 - 35.7 g/dL   RDW 78.4 69.6 - 29.5 %   Platelets 269 150 -  450 x10E3/uL   Neutrophils 56 Not Estab. %   Lymphs 38 Not Estab. %   Monocytes 6 Not Estab. %   Eos 0 Not Estab. %   Basos 0 Not Estab. %   Neutrophils Absolute 3.9 1.4 - 7.0 x10E3/uL   Lymphocytes Absolute 2.7 0.7 - 3.1 x10E3/uL   Monocytes Absolute 0.4 0.1 - 0.9 x10E3/uL   EOS (ABSOLUTE) 0.0 0.0 - 0.4 x10E3/uL   Basophils Absolute 0.0 0.0 - 0.2 x10E3/uL   Immature Granulocytes 0 Not Estab. %   Immature Grans (Abs) 0.0 0.0 - 0.1 x10E3/uL  Comprehensive metabolic panel   Collection Time: 11/05/23  2:45 PM  Result Value Ref Range   Glucose 78 70 - 99 mg/dL   BUN 12 6 - 20 mg/dL   Creatinine, Ser 1.61 (H) 0.57 - 1.00 mg/dL   eGFR 74 >09 UE/AVW/0.98   BUN/Creatinine Ratio 12 9 - 23   Sodium 139 134 - 144 mmol/L   Potassium 3.5 3.5 - 5.2 mmol/L   Chloride 99 96 - 106 mmol/L   CO2 25 20 - 29 mmol/L   Calcium 9.8 8.7 - 10.2 mg/dL   Total Protein 7.9 6.0 - 8.5 g/dL   Albumin  5.2 (H) 3.9 - 4.9 g/dL   Globulin, Total 2.7 1.5 - 4.5 g/dL   Bilirubin Total 0.5 0.0 - 1.2 mg/dL   Alkaline Phosphatase 93 44 - 121 IU/L   AST 19 0 - 40 IU/L   ALT 27 0 - 32 IU/L  Lipid panel   Collection Time: 11/05/23  2:45 PM  Result Value Ref Range   Cholesterol, Total 202 (H) 100 - 199 mg/dL   Triglycerides 68 0 - 149 mg/dL   HDL 84 >11 mg/dL   VLDL Cholesterol Cal 12 5 - 40 mg/dL   LDL Chol Calc (NIH) 914 (H) 0 - 99 mg/dL   Chol/HDL Ratio 2.4 0.0 - 4.4 ratio  TSH   Collection Time: 11/05/23  2:45 PM  Result Value Ref Range   TSH 1.940 0.450 - 4.500 uIU/mL      Assessment & Plan:   Problem List Items Addressed This Visit   None      Follow up plan: No follow-ups on file.   This visit was completed via MyChart due to the restrictions of the COVID-19 pandemic. All issues as above were discussed and addressed. Physical exam was done as above through visual confirmation on MyChart. If it was felt that the patient should be evaluated in the office, they were directed there. The patient verbally consented to this visit. Location of the patient: Home Location of the provider: Office Those involved with this call:  Provider: Aileen Alexanders, NP CMA: Mancil Seat, CMA Front Desk/Registration: Jaynee Meyer This encounter was conducted via video.  I spent 30 dedicated to the care of this patient on the date of this encounter to include previsit review of symptoms, plan of are and follow up, face to face time with the patient, and post visit ordering of testing.

## 2024-03-10 ENCOUNTER — Ambulatory Visit (INDEPENDENT_AMBULATORY_CARE_PROVIDER_SITE_OTHER): Admitting: Nurse Practitioner

## 2024-03-10 ENCOUNTER — Ambulatory Visit: Payer: Self-pay

## 2024-03-10 ENCOUNTER — Encounter: Payer: Self-pay | Admitting: Nurse Practitioner

## 2024-03-10 VITALS — BP 125/79 | HR 105 | Temp 98.2°F | Ht 66.0 in | Wt 189.4 lb

## 2024-03-10 DIAGNOSIS — J0110 Acute frontal sinusitis, unspecified: Secondary | ICD-10-CM

## 2024-03-10 DIAGNOSIS — J321 Chronic frontal sinusitis: Secondary | ICD-10-CM | POA: Insufficient documentation

## 2024-03-10 MED ORDER — PROMETHAZINE-DM 6.25-15 MG/5ML PO SYRP: 5.0000 mL | ORAL_SOLUTION | Freq: Four times a day (QID) | ORAL | 0 refills | Status: DC | PRN

## 2024-03-10 MED ORDER — DOXYCYCLINE HYCLATE 100 MG PO TABS
100.0000 mg | ORAL_TABLET | Freq: Two times a day (BID) | ORAL | 0 refills | Status: AC
Start: 2024-03-10 — End: 2024-03-17

## 2024-03-10 NOTE — Progress Notes (Signed)
 BP 125/79   Pulse (!) 105   Temp 98.2 F (36.8 C) (Oral)   Ht 5' 6 (1.676 m)   Wt 189 lb 6.4 oz (85.9 kg)   SpO2 99%   BMI 30.57 kg/m    Subjective:    Patient ID: Jean Gay, female    DOB: September 11, 1991, 33 y.o.   MRN: [id number]  HPI: Jean Gay is a 33 y.o. female  Chief Complaint  Patient presents with   Cough    Patient states she has been having a cough, congestion, ear pain, headache, and mild SOB for the last week and a few days. States she has also noticed a low grade fever and diarrhea as well    Chest Congestion   UPPER RESPIRATORY TRACT INFECTION Started with symptoms a bit over a week ago. Initially got three year old from daycare, he was sick at the time. He was sick for two days.  Then she began with symptoms, deep cough. Has had major diarrhea and nausea with this.  No Covid or Flu testing at home. Fever: yes, initially but not within 1-2 days + has been very sweaty at night. Cough: yes Shortness of breath: mild Wheezing: occasional  Chest pain: no Chest tightness: yes Chest congestion: yes Nasal congestion: yes Runny nose: yes Post nasal drip: yes Sneezing: no Sore throat: yes Swollen glands: no Sinus pressure: yes Headache: yes Face pain: no Toothache: no Ear pain: yes bilateral Ear pressure: yes bilateral Eyes red/itching:no Eye drainage/crusting: no  Vomiting: as above nausea and diarrhea Rash: no Fatigue: yes Sick contacts: yes Strep contacts: no  Context: fluctuating Recurrent sinusitis: no Relief with OTC cold/cough medications: a little  Treatments attempted: cold/sinus, mucinex, and cough syrup    Relevant past medical, surgical, family and social history reviewed and updated as indicated. Interim medical history since our last visit reviewed. Allergies and medications reviewed and updated.  Review of Systems  Constitutional:  Positive for chills, diaphoresis, fatigue and fever. Negative for activity change and appetite  change.  HENT:  Positive for congestion, ear pain, postnasal drip, rhinorrhea, sinus pressure, sinus pain and sore throat. Negative for ear discharge, facial swelling, sneezing and voice change.   Respiratory:  Positive for cough, chest tightness, shortness of breath and wheezing.   Cardiovascular:  Negative for chest pain, palpitations and leg swelling.  Gastrointestinal:  Positive for nausea. Negative for abdominal distention, abdominal pain, constipation, diarrhea and vomiting.  Musculoskeletal:  Positive for myalgias.  Neurological:  Positive for headaches. Negative for dizziness and numbness.  Psychiatric/Behavioral: Negative.      Per HPI unless specifically indicated above     Objective:    BP 125/79   Pulse (!) 105   Temp 98.2 F (36.8 C) (Oral)   Ht 5' 6 (1.676 m)   Wt 189 lb 6.4 oz (85.9 kg)   SpO2 99%   BMI 30.57 kg/m   Wt Readings from Last 3 Encounters:  03/10/24 189 lb 6.4 oz (85.9 kg)  11/28/23 189 lb 6.4 oz (85.9 kg)  11/05/23 191 lb (86.6 kg)    Physical Exam Vitals and nursing note reviewed.  Constitutional:      General: She is awake. She is not in acute distress.    Appearance: She is well-developed and well-groomed. She is ill-appearing. She is not toxic-appearing.  HENT:     Head: Normocephalic.     Right Ear: Hearing, ear canal and external ear normal. A middle ear effusion is present. Tympanic membrane  is not injected or perforated.     Left Ear: Hearing, ear canal and external ear normal. A middle ear effusion is present. Tympanic membrane is not injected or perforated.     Nose: Rhinorrhea present. Rhinorrhea is clear.     Right Sinus: Frontal sinus tenderness present. No maxillary sinus tenderness.     Left Sinus: Frontal sinus tenderness present. No maxillary sinus tenderness.     Mouth/Throat:     Mouth: Mucous membranes are moist.     Pharynx: Posterior oropharyngeal erythema (mild) present. No pharyngeal swelling or oropharyngeal exudate.    Eyes:     General: Lids are normal.        Right eye: No discharge.        Left eye: No discharge.     Conjunctiva/sclera: Conjunctivae normal.     Pupils: Pupils are equal, round, and reactive to light.   Neck:     Thyroid: No thyromegaly.     Vascular: No carotid bruit.   Cardiovascular:     Rate and Rhythm: Normal rate and regular rhythm.     Heart sounds: Normal heart sounds. No murmur heard.    No gallop.  Pulmonary:     Effort: Pulmonary effort is normal. No accessory muscle usage or respiratory distress.     Breath sounds: Wheezing (mild noted throughout on expiration) present. No decreased breath sounds, rhonchi or rales.  Abdominal:     General: Bowel sounds are normal. There is no distension.     Palpations: Abdomen is soft.     Tenderness: There is no abdominal tenderness.   Musculoskeletal:     Cervical back: Normal range of motion and neck supple.     Right lower leg: No edema.     Left lower leg: No edema.  Lymphadenopathy:     Head:     Right side of head: Submandibular and tonsillar adenopathy present. No submental, preauricular or posterior auricular adenopathy.     Left side of head: Submandibular and tonsillar adenopathy present. No submental, preauricular or posterior auricular adenopathy.     Cervical: No cervical adenopathy.   Skin:    General: Skin is warm and dry.   Neurological:     Mental Status: She is alert and oriented to person, place, and time.   Psychiatric:        Attention and Perception: Attention normal.        Mood and Affect: Mood normal.        Speech: Speech normal.        Behavior: Behavior normal. Behavior is cooperative.        Thought Content: Thought content normal.     Results for orders placed or performed in visit on 11/05/23  Urinalysis, Routine w reflex microscopic   Collection Time: 11/05/23  2:44 PM  Result Value Ref Range   Specific Gravity, UA 1.010 1.005 - 1.030   pH, UA 6.5 5.0 - 7.5   Color, UA Yellow  Yellow   Appearance Ur Clear Clear   Leukocytes,UA Negative Negative   Protein,UA Negative Negative/Trace   Glucose, UA Negative Negative   Ketones, UA Negative Negative   RBC, UA Negative Negative   Bilirubin, UA Negative Negative   Urobilinogen, Ur 0.2 0.2 - 1.0 mg/dL   Nitrite, UA Negative Negative   Microscopic Examination Comment   CBC with Differential/Platelet   Collection Time: 11/05/23  2:45 PM  Result Value Ref Range   WBC 7.1 3.4 - 10.8 x10E3/uL  RBC 4.29 3.77 - 5.28 x10E6/uL   Hemoglobin 13.8 11.1 - 15.9 g/dL   Hematocrit 59.6 65.9 - 46.6 %   MCV 94 79 - 97 fL   MCH 32.2 26.6 - 33.0 pg   MCHC 34.2 31.5 - 35.7 g/dL   RDW 88.0 88.2 - 84.5 %   Platelets 269 150 - 450 x10E3/uL   Neutrophils 56 Not Estab. %   Lymphs 38 Not Estab. %   Monocytes 6 Not Estab. %   Eos 0 Not Estab. %   Basos 0 Not Estab. %   Neutrophils Absolute 3.9 1.4 - 7.0 x10E3/uL   Lymphocytes Absolute 2.7 0.7 - 3.1 x10E3/uL   Monocytes Absolute 0.4 0.1 - 0.9 x10E3/uL   EOS (ABSOLUTE) 0.0 0.0 - 0.4 x10E3/uL   Basophils Absolute 0.0 0.0 - 0.2 x10E3/uL   Immature Granulocytes 0 Not Estab. %   Immature Grans (Abs) 0.0 0.0 - 0.1 x10E3/uL  Comprehensive metabolic panel   Collection Time: 11/05/23  2:45 PM  Result Value Ref Range   Glucose 78 70 - 99 mg/dL   BUN 12 6 - 20 mg/dL   Creatinine, Ser 8.95 (H) 0.57 - 1.00 mg/dL   eGFR 74 >40 fO/fpw/8.26   BUN/Creatinine Ratio 12 9 - 23   Sodium 139 134 - 144 mmol/L   Potassium 3.5 3.5 - 5.2 mmol/L   Chloride 99 96 - 106 mmol/L   CO2 25 20 - 29 mmol/L   Calcium 9.8 8.7 - 10.2 mg/dL   Total Protein 7.9 6.0 - 8.5 g/dL   Albumin 5.2 (H) 3.9 - 4.9 g/dL   Globulin, Total 2.7 1.5 - 4.5 g/dL   Bilirubin Total 0.5 0.0 - 1.2 mg/dL   Alkaline Phosphatase 93 44 - 121 IU/L   AST 19 0 - 40 IU/L   ALT 27 0 - 32 IU/L  Lipid panel   Collection Time: 11/05/23  2:45 PM  Result Value Ref Range   Cholesterol, Total 202 (H) 100 - 199 mg/dL   Triglycerides 68 0 - 149  mg/dL   HDL 84 >60 mg/dL   VLDL Cholesterol Cal 12 5 - 40 mg/dL   LDL Chol Calc (NIH) 893 (H) 0 - 99 mg/dL   Chol/HDL Ratio 2.4 0.0 - 4.4 ratio  TSH   Collection Time: 11/05/23  2:45 PM  Result Value Ref Range   TSH 1.940 0.450 - 4.500 uIU/mL      Assessment & Plan:   Problem List Items Addressed This Visit       Respiratory   Frontal sinusitis - Primary   Acute, for over one week.  No Covid or flu testing, is past dates for treatment.  Son and mother are sick as well, son improved.  Due to length of illness start Doxycyline and cough medication.  Offered Prednisone for 5 days, but does not tolerate steroids.  Recommend: - Increased rest - Increasing Fluids - Acetaminophen  / ibuprofen  as needed for fever/pain.  - Salt water gargling, chloraseptic spray and throat lozenges - Mucinex.  - Saline sinus flushes or a neti pot.  - Humidifying the air.       Relevant Medications   doxycycline (VIBRA-TABS) 100 MG tablet   promethazine -dextromethorphan (PROMETHAZINE -DM) 6.25-15 MG/5ML syrup     Follow up plan: Return if symptoms worsen or fail to improve.

## 2024-03-10 NOTE — Telephone Encounter (Signed)
 FYI Only or Action Required?: FYI only for provider.  Patient was last seen in primary care on 01/06/2024 by Melvin Pao, NP. Called Nurse Triage reporting Cough. Symptoms began a week ago. Interventions attempted: OTC medications: Mucinex and Tylenol . Symptoms are: gradually worsening.  Triage Disposition: see HCP in office today Patient/caregiver understands and will follow disposition?: Yes       Copied from CRM [id number]. Topic: Clinical - Red Word Triage >> Mar 10, 2024 11:44 AM Elle L wrote: Red Word that prompted transfer to Nurse Triage: The patient's son was sick and it has spread to her and her Mother. Her Mother now has bronchitis. The patient states she has had a on and off fever, head and chest congestion, chest feels heavy, worsening productive cough, bad headache, throat is sore from drainage or coughing, and she has been taking mucinex but she has been having discolored mucous. Reason for Disposition  [1] MILD difficulty breathing (e.g., minimal/no SOB at rest, SOB with walking, pulse <100) AND [2] still present when not coughing  Answer Assessment - Initial Assessment Questions 1. ONSET: When did the cough begin?      03/04/24 2. SEVERITY: How bad is the cough today?      Frequent cough and congested but cannot get the phlegm up  3. SPUTUM: Describe the color of your sputum (none, dry cough; clear, white, yellow, green)     Clear sometimes green  4. HEMOPTYSIS: Are you coughing up any blood? If so ask: How much? (flecks, streaks, tablespoons, etc.)     *No Answer* 5. DIFFICULTY BREATHING: Are you having difficulty breathing? If Yes, ask: How bad is it? (e.g., mild, moderate, severe)    - MILD: No SOB at rest, mild SOB with walking, speaks normally in sentences, can lie down, no retractions, pulse < 100.    - MODERATE: SOB at rest, SOB with minimal exertion and prefers to sit, cannot lie down flat, speaks in phrases, mild retractions, audible wheezing,  pulse 100-120.    - SEVERE: Very SOB at rest, speaks in single words, struggling to breathe, sitting hunched forward, retractions, pulse > 120      moderate 6. FEVER: Do you have a fever? If Yes, ask: What is your temperature, how was it measured, and when did it start?     Subjective low grade fever (feels hot) 10. OTHER SYMPTOMS: Do you have any other symptoms? (e.g., runny nose, wheezing, chest pain)    Feels warm, chest feels heavy , lower left back pain, head congestion and nasal drainage (clear to tinge of yellow green), sweating, nausea, dizziness Has been taking sudafed, Mucinex D, Mucinex, Tylenol   Protocols used: Cough - Acute Productive-A-AH

## 2024-03-10 NOTE — Patient Instructions (Signed)

## 2024-03-10 NOTE — Assessment & Plan Note (Signed)
 Acute, for over one week.  No Covid or flu testing, is past dates for treatment.  Son and mother are sick as well, son improved.  Due to length of illness start Doxycyline and cough medication.  Offered Prednisone for 5 days, but does not tolerate steroids.  Recommend: - Increased rest - Increasing Fluids - Acetaminophen  / ibuprofen  as needed for fever/pain.  - Salt water gargling, chloraseptic spray and throat lozenges - Mucinex.  - Saline sinus flushes or a neti pot.  - Humidifying the air.

## 2024-04-02 ENCOUNTER — Other Ambulatory Visit: Payer: Self-pay | Admitting: Nurse Practitioner

## 2024-04-03 NOTE — Telephone Encounter (Signed)
 Requested Prescriptions  Pending Prescriptions Disp Refills   citalopram  (CELEXA ) 40 MG tablet [Pharmacy Med Name: CITALOPRAM  HBR 40 MG TABLET] 90 tablet 1    Sig: TAKE 1 TABLET BY MOUTH EVERY DAY     Psychiatry:  Antidepressants - SSRI Passed - 04/03/2024 10:55 AM      Passed - Completed PHQ-2 or PHQ-9 in the last 360 days      Passed - Valid encounter within last 6 months    Recent Outpatient Visits           3 weeks ago Acute non-recurrent frontal sinusitis   [facility] [place] [place] T, NP   2 months ago Moderate episode of recurrent major depressive disorder [facility])   [place] [facility] Melvin Pao, NP   4 months ago Moderate episode of recurrent major depressive disorder [facility])   [place] [facility] Melvin Pao, NP   5 months ago Annual physical exam   [place] [facility] Melvin Pao, NP   5 months ago Adult ADHD   [place] [facility] [place] Hadassah SQUIBB, MD

## 2024-04-14 ENCOUNTER — Encounter: Payer: Self-pay | Admitting: Nurse Practitioner

## 2024-04-21 NOTE — Telephone Encounter (Signed)
 Called patient lvm for patient to call and schedule office visit

## 2024-04-23 NOTE — Telephone Encounter (Signed)
 Called patient and left a message for her to call back to get scheduled for an OV next available opening is fine to be scheduled.

## 2024-04-28 NOTE — Telephone Encounter (Signed)
 Appt scheduled

## 2024-05-01 ENCOUNTER — Ambulatory Visit: Admitting: Pediatrics

## 2024-05-01 VITALS — BP 107/74 | HR 67 | Wt 186.0 lb

## 2024-05-01 DIAGNOSIS — F411 Generalized anxiety disorder: Secondary | ICD-10-CM

## 2024-05-01 DIAGNOSIS — F909 Attention-deficit hyperactivity disorder, unspecified type: Secondary | ICD-10-CM | POA: Diagnosis not present

## 2024-05-01 DIAGNOSIS — Z1330 Encounter for screening examination for mental health and behavioral disorders, unspecified: Secondary | ICD-10-CM | POA: Diagnosis not present

## 2024-05-01 DIAGNOSIS — F331 Major depressive disorder, recurrent, moderate: Secondary | ICD-10-CM

## 2024-05-01 MED ORDER — AMPHETAMINE-DEXTROAMPHETAMINE 10 MG PO TABS: 10.0000 mg | ORAL_TABLET | Freq: Every day | ORAL | 0 refills | Status: DC | PRN

## 2024-05-01 MED ORDER — HYDROXYZINE HCL 10 MG PO TABS: 5.0000 mg | ORAL_TABLET | Freq: Three times a day (TID) | ORAL | 0 refills | Status: DC | PRN
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
## 2024-05-01 NOTE — Patient Instructions (Signed)
 I am also sending hydroxizine 10mg  as needed. Feel free to break this in half if you find it too sedating. Max 30mg  total per day (three of the 10mg  tabs).  I sent short acting adderall 10mg  to try

## 2024-05-02 ENCOUNTER — Encounter: Payer: Self-pay | Admitting: Pediatrics

## 2024-05-02 NOTE — Assessment & Plan Note (Signed)
 Citalopram  prevents depressive episodes but not anxiety. Previous antidepressants ineffective. Genetic swab may identify better options for anxiety. - Continue citalopram . - Gene sight collection pending

## 2024-05-02 NOTE — Assessment & Plan Note (Signed)
 Adderall manages ADHD but increases anxiety. Extended-release causes prolonged effects and sleep issues. Short-acting may reduce anxiety and improve dosing control. - Switch to Adderall short-acting formulation. - Consider Focalin if anxiety persists. - Monitor sleep disturbances and adjust dosing.

## 2024-05-02 NOTE — Assessment & Plan Note (Signed)
 Anxiety worsened by Adderall, causing edginess and obsessive thoughts. Hydroxyzine  recommended for as-needed use. Genetic swab may guide medication choices. - Prescribe hydroxyzine  as needed.

## 2024-05-22 ENCOUNTER — Telehealth (INDEPENDENT_AMBULATORY_CARE_PROVIDER_SITE_OTHER): Admitting: Pediatrics

## 2024-05-22 ENCOUNTER — Encounter: Payer: Self-pay | Admitting: Pediatrics

## 2024-05-22 DIAGNOSIS — F331 Major depressive disorder, recurrent, moderate: Secondary | ICD-10-CM | POA: Diagnosis not present

## 2024-05-22 DIAGNOSIS — F909 Attention-deficit hyperactivity disorder, unspecified type: Secondary | ICD-10-CM | POA: Diagnosis not present

## 2024-05-22 DIAGNOSIS — F411 Generalized anxiety disorder: Secondary | ICD-10-CM | POA: Diagnosis not present

## 2024-05-22 MED ORDER — AMPHETAMINE-DEXTROAMPHETAMINE 10 MG PO TABS: 10.0000 mg | ORAL_TABLET | Freq: Every day | ORAL | 0 refills | Status: AC | PRN

## 2024-05-22 MED ORDER — HYDROXYZINE HCL 10 MG PO TABS: 5.0000 mg | ORAL_TABLET | Freq: Three times a day (TID) | ORAL | 0 refills | Status: DC | PRN

## 2024-05-22 MED ORDER — CITALOPRAM HYDROBROMIDE 40 MG PO TABS: 40.0000 mg | ORAL_TABLET | Freq: Every day | ORAL | 3 refills | Status: AC

## 2024-05-22 NOTE — Progress Notes (Signed)
 Telehealth Visit  I connected with  Jean Gay on 05/22/24 by a video enabled telemedicine application and verified that I am speaking with the correct person using two identifiers.   I discussed the limitations of evaluation and management by telemedicine. The patient expressed understanding and agreed to proceed.  Subjective:    Patient ID: Jean Gay, female    DOB: 08-10-1991, 33 y.o.   MRN: [id number]  HPI: Jean Gay is a 33 y.o. female  Chief Complaint  Patient presents with   ADHD    Things are going pretty good believes that the changes that was made with medication was perfect for her     Discussed the use of AI scribe software for clinical note transcription with the patient, who gave verbal consent to proceed.  History of Present Illness   Jean Gay is a 33 year old female who presents for follow-up regarding her medication regimen for ADHD and anxiety.  She finds her current medication regimen effective in managing ADHD symptoms throughout the workday. The standard release formulation provides adequate coverage, whereas the extended release was too long-lasting. She takes Adderall on an as-needed basis, typically three workdays a week, especially on days requiring prolonged computer use, which helps manage her distractibility.  For anxiety, she uses a half pill of her prescribed medication as needed, not daily. This approach is sufficient to help her calm down and manage anxiety independently.  She mentions that her depression symptoms have also leveled out with the current medication regimen.      Relevant past medical, surgical, family and social history reviewed and updated as indicated. Interim medical history since our last visit reviewed. Allergies and medications reviewed and updated.  ROS per HPI unless specifically indicated above     Objective:    LMP 05/17/2024 (Exact Date)   Wt Readings from Last 3 Encounters:  05/01/24 186 lb (84.4  kg)  03/10/24 189 lb 6.4 oz (85.9 kg)  11/28/23 189 lb 6.4 oz (85.9 kg)     Physical Exam Constitutional:      General: She is not in acute distress.    Appearance: Normal appearance.  Neurological:     General: No focal deficit present.     Mental Status: She is alert. Mental status is at baseline.      LIMITED EXAM GIVEN VIDEO VISIT     Assessment & Plan:  Assessment & Plan   Moderate episode of recurrent major depressive disorder (HCC) GAD (generalized anxiety disorder) Assessment & Plan: Mood stable, depression symptoms managed. - Continue celexa  40mg , hdyroxyzine prn - Discussed option of genetic swab for medication compatibility if needed. - Schedule follow-up appointment in December to assess regimen effectiveness. - Instruct to contact office if medication stops working or concerns arise.   Orders: -     Citalopram  Hydrobromide; Take 1 tablet (40 mg total) by mouth daily.  Dispense: 90 tablet; Refill: 3  -     hydrOXYzine  HCl; Take 0.5-1 tablets (5-10 mg total) by mouth 3 (three) times daily as needed.  Dispense: 30 tablet; Refill: 0  Adult ADHD Assessment & Plan: Current medication regimen effective. Short acting Adderall optimal for workday and has eliminated bulk of anxiety on stimulant medication.  - Continue current medication regimen with standard release Adderall and atarax  prn - Set medications on automatic refill until next follow-up in three months. - Advise taking Adderall two to three days off per week to prevent dosage increases.  Orders: -  Amphetamine -Dextroamphetamine ; Take 1 tablet (10 mg total) by mouth daily as needed.  Dispense: 30 tablet; Refill: 0 -     Amphetamine -Dextroamphetamine ; Take 1 tablet (10 mg total) by mouth daily as needed.  Dispense: 30 tablet; Refill: 0 -     Amphetamine -Dextroamphetamine ; Take 1 tablet (10 mg total) by mouth daily as needed.  Dispense: 30 tablet; Refill: 0     Follow up plan: Return in about 3 months  (around 08/21/2024) for Mood.  Jean SHAUNNA Nett, MD   This visit was completed via video visit through MyChart due to the restrictions of the COVID-19 pandemic. All issues as above were discussed and addressed. Physical exam was done as above through visual confirmation on video through MyChart. If it was felt that the patient should be evaluated in the office, they were directed there. The patient verbally consented to this visit.  Location of the patient: home Location of the provider: work Those involved with this call:  Provider: Hadassah Nett, MD CMA: Cena Maffucci, CMA Time spent on call: 15 minutes with patient face to face via video conference. More than 50% of this time was spent in counseling and coordination of care. 15 minutes total spent in review of patient's record and preparation of their chart. Total time spent on this encounter: 30 minutes.

## 2024-05-22 NOTE — Assessment & Plan Note (Addendum)
 Current medication regimen effective. Short acting Adderall optimal for workday and has eliminated bulk of anxiety on stimulant medication.  - Continue current medication regimen with standard release Adderall and atarax  prn - Set medications on automatic refill until next follow-up in three months. - Advise taking Adderall two to three days off per week to prevent dosage increases.

## 2024-05-22 NOTE — Assessment & Plan Note (Addendum)
 Mood stable, depression symptoms managed. - Continue celexa  40mg , hdyroxyzine prn - Discussed option of genetic swab for medication compatibility if needed. - Schedule follow-up appointment in December to assess regimen effectiveness. - Instruct to contact office if medication stops working or concerns arise.

## 2024-05-22 NOTE — Progress Notes (Signed)
 Called patient lvm to call and schedule 3 month follow up for mood

## 2024-05-25 NOTE — Progress Notes (Signed)
 Appt scheduled

## 2024-08-15 ENCOUNTER — Encounter: Admitting: Nurse Practitioner

## 2024-08-15 DIAGNOSIS — R399 Unspecified symptoms and signs involving the genitourinary system: Secondary | ICD-10-CM

## 2024-08-15 NOTE — Progress Notes (Signed)
 Because you are listing several different symptoms, I feel your condition warrants further evaluation and I recommend that you be seen for a face to face visit for a vaginal swab and urine test.  Please contact your primary care physician practice to be seen. Many offices offer virtual options to be seen via video if you are not comfortable going in person to a medical facility at this time. If you feel you can not wait, you can also be seen at an urgent care.   NOTE: You will NOT be charged for this eVisit.  If you do not have a PCP, [facility] offers a free physician referral service available at [phone]. Our trained staff has the experience, knowledge and resources to put you in touch with a physician who is right for you.    If you are having a true medical emergency please call 911.   Your e-visit answers were reviewed by a board certified advanced clinical practitioner to complete your personal care plan.  Thank you for using e-Visits.

## 2024-08-24 ENCOUNTER — Encounter: Payer: Self-pay | Admitting: Nurse Practitioner

## 2024-08-24 ENCOUNTER — Ambulatory Visit: Admitting: Nurse Practitioner

## 2024-08-24 VITALS — BP 105/67 | HR 74 | Temp 99.5°F | Ht 66.0 in | Wt 187.0 lb

## 2024-08-24 DIAGNOSIS — F909 Attention-deficit hyperactivity disorder, unspecified type: Secondary | ICD-10-CM

## 2024-08-24 DIAGNOSIS — F331 Major depressive disorder, recurrent, moderate: Secondary | ICD-10-CM | POA: Diagnosis not present

## 2024-08-24 DIAGNOSIS — Z23 Encounter for immunization: Secondary | ICD-10-CM

## 2024-08-24 NOTE — Assessment & Plan Note (Signed)
Chronic.  Controlled.  Continue with current medication regimen of Celexa.  Labs ordered today.  Return to clinic in 6 months for reevaluation.  Call sooner if concerns arise.   
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
## 2024-08-24 NOTE — Assessment & Plan Note (Signed)
 Current medication regimen effective. Short acting Adderall optimal for workday and has eliminated bulk of anxiety on stimulant medication.  - Continue current medication regimen with standard release Adderall and atarax  prn - Patient should still have refills. Will send if needed. - Advise taking Adderall two to three days off per week to prevent dosage increases. - Follow up in 3 months

## 2024-09-05 ENCOUNTER — Other Ambulatory Visit: Payer: Self-pay | Admitting: Pediatrics

## 2024-09-05 DIAGNOSIS — F411 Generalized anxiety disorder: Secondary | ICD-10-CM

## 2024-09-05 DIAGNOSIS — F331 Major depressive disorder, recurrent, moderate: Secondary | ICD-10-CM

## 2024-09-23 ENCOUNTER — Other Ambulatory Visit: Payer: Self-pay | Admitting: Pediatrics

## 2024-09-23 DIAGNOSIS — F411 Generalized anxiety disorder: Secondary | ICD-10-CM

## 2024-11-23 ENCOUNTER — Ambulatory Visit: Admitting: Nurse Practitioner
